# Patient Record
Sex: Female | Born: 1980 | Race: Black or African American | Hispanic: No | Marital: Single | Smoking: Never smoker
Health system: Southern US, Community
[De-identification: ages and names within clinical notes are randomized; demographics above are authoritative.]

## PROBLEM LIST (undated history)

## (undated) DIAGNOSIS — J45909 Unspecified asthma, uncomplicated: Secondary | ICD-10-CM

## (undated) DIAGNOSIS — F909 Attention-deficit hyperactivity disorder, unspecified type: Secondary | ICD-10-CM

## (undated) DIAGNOSIS — Z98811 Dental restoration status: Secondary | ICD-10-CM

## (undated) DIAGNOSIS — K219 Gastro-esophageal reflux disease without esophagitis: Secondary | ICD-10-CM

## (undated) DIAGNOSIS — M654 Radial styloid tenosynovitis [de Quervain]: Secondary | ICD-10-CM

## (undated) DIAGNOSIS — M1990 Unspecified osteoarthritis, unspecified site: Secondary | ICD-10-CM

## (undated) HISTORY — DX: Gastro-esophageal reflux disease without esophagitis: K21.9

## (undated) HISTORY — PX: KNEE ARTHROSCOPY: SUR90

## (undated) HISTORY — DX: Attention-deficit hyperactivity disorder, unspecified type: F90.9

## (undated) HISTORY — PX: TONSILLECTOMY AND ADENOIDECTOMY: SHX28

---
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
## 2000-04-20 ENCOUNTER — Encounter: Payer: Self-pay | Admitting: Internal Medicine

## 2000-04-20 ENCOUNTER — Emergency Department (HOSPITAL_COMMUNITY): Admission: EM | Admit: 2000-04-20 | Discharge: 2000-04-20 | Payer: Self-pay | Admitting: Internal Medicine

## 2001-03-10 ENCOUNTER — Other Ambulatory Visit: Admission: RE | Admit: 2001-03-10 | Discharge: 2001-03-10 | Payer: Self-pay | Admitting: *Deleted

## 2001-03-16 ENCOUNTER — Ambulatory Visit (HOSPITAL_COMMUNITY): Admission: RE | Admit: 2001-03-16 | Discharge: 2001-03-16 | Payer: Self-pay | Admitting: Gastroenterology

## 2002-11-17 ENCOUNTER — Emergency Department (HOSPITAL_COMMUNITY): Admission: EM | Admit: 2002-11-17 | Discharge: 2002-11-17 | Payer: Self-pay | Admitting: Emergency Medicine

## 2002-11-17 ENCOUNTER — Ambulatory Visit (HOSPITAL_COMMUNITY): Admission: RE | Admit: 2002-11-17 | Discharge: 2002-11-17 | Payer: Self-pay | Admitting: Internal Medicine

## 2003-07-16 ENCOUNTER — Emergency Department (HOSPITAL_COMMUNITY): Admission: EM | Admit: 2003-07-16 | Discharge: 2003-07-17 | Payer: Self-pay | Admitting: Emergency Medicine

## 2005-02-12 ENCOUNTER — Emergency Department (HOSPITAL_COMMUNITY): Admission: EM | Admit: 2005-02-12 | Discharge: 2005-02-12 | Payer: Self-pay | Admitting: Emergency Medicine

## 2005-11-05 ENCOUNTER — Ambulatory Visit: Payer: Self-pay | Admitting: Internal Medicine

## 2005-12-23 ENCOUNTER — Ambulatory Visit: Payer: Self-pay | Admitting: Internal Medicine

## 2006-02-17 ENCOUNTER — Emergency Department (HOSPITAL_COMMUNITY): Admission: EM | Admit: 2006-02-17 | Discharge: 2006-02-18 | Payer: Self-pay | Admitting: Emergency Medicine

## 2006-02-18 ENCOUNTER — Emergency Department (HOSPITAL_COMMUNITY): Admission: EM | Admit: 2006-02-18 | Discharge: 2006-02-18 | Payer: Self-pay | Admitting: Emergency Medicine

## 2006-02-18 ENCOUNTER — Ambulatory Visit (HOSPITAL_COMMUNITY): Admission: RE | Admit: 2006-02-18 | Discharge: 2006-02-18 | Payer: Self-pay | Admitting: Emergency Medicine

## 2006-02-18 ENCOUNTER — Encounter (INDEPENDENT_AMBULATORY_CARE_PROVIDER_SITE_OTHER): Payer: Self-pay | Admitting: *Deleted

## 2006-04-14 ENCOUNTER — Ambulatory Visit: Payer: Self-pay | Admitting: Internal Medicine

## 2006-04-15 LAB — CONVERTED CEMR LAB
ALT: 19 units/L (ref 0–40)
AST: 21 units/L (ref 0–37)
Albumin: 3.8 g/dL (ref 3.5–5.2)
Alkaline Phosphatase: 50 units/L (ref 39–117)
Amylase: 146 units/L — ABNORMAL HIGH (ref 27–131)
Basophils Absolute: 0 10*3/uL (ref 0.0–0.1)
Basophils Relative: 0.2 % (ref 0.0–1.0)
Bilirubin, Direct: 0.1 mg/dL (ref 0.0–0.3)
Eosinophil percent: 2 % (ref 0.0–5.0)
HCT: 36.6 % (ref 36.0–46.0)
Hemoglobin: 12.3 g/dL (ref 12.0–15.0)
Lymphocytes Relative: 28.9 % (ref 12.0–46.0)
MCHC: 33.5 g/dL (ref 30.0–36.0)
MCV: 93.4 fL (ref 78.0–100.0)
Monocytes Absolute: 0.3 10*3/uL (ref 0.2–0.7)
Monocytes Relative: 6.7 % (ref 3.0–11.0)
Neutro Abs: 2.9 10*3/uL (ref 1.4–7.7)
Neutrophils Relative %: 62.2 % (ref 43.0–77.0)
Platelets: 325 10*3/uL (ref 150–400)
RBC: 3.92 M/uL (ref 3.87–5.11)
RDW: 12.9 % (ref 11.5–14.6)
Total Bilirubin: 0.6 mg/dL (ref 0.3–1.2)
Total Protein: 7.3 g/dL (ref 6.0–8.3)
WBC: 4.7 10*3/uL (ref 4.5–10.5)

## 2006-08-09 ENCOUNTER — Emergency Department (HOSPITAL_COMMUNITY): Admission: EM | Admit: 2006-08-09 | Discharge: 2006-08-09 | Payer: Self-pay | Admitting: Emergency Medicine

## 2006-10-16 ENCOUNTER — Ambulatory Visit: Payer: Self-pay | Admitting: Family Medicine

## 2006-12-14 ENCOUNTER — Ambulatory Visit: Payer: Self-pay | Admitting: Family Medicine

## 2006-12-14 DIAGNOSIS — B353 Tinea pedis: Secondary | ICD-10-CM

## 2006-12-14 DIAGNOSIS — J309 Allergic rhinitis, unspecified: Secondary | ICD-10-CM | POA: Insufficient documentation

## 2006-12-14 DIAGNOSIS — J019 Acute sinusitis, unspecified: Secondary | ICD-10-CM | POA: Insufficient documentation

## 2006-12-21 ENCOUNTER — Telehealth (INDEPENDENT_AMBULATORY_CARE_PROVIDER_SITE_OTHER): Payer: Self-pay | Admitting: *Deleted

## 2007-04-28 ENCOUNTER — Emergency Department (HOSPITAL_COMMUNITY): Admission: EM | Admit: 2007-04-28 | Discharge: 2007-04-29 | Payer: Self-pay | Admitting: Emergency Medicine

## 2007-04-28 ENCOUNTER — Ambulatory Visit: Payer: Self-pay | Admitting: Internal Medicine

## 2007-04-28 DIAGNOSIS — R109 Unspecified abdominal pain: Secondary | ICD-10-CM | POA: Insufficient documentation

## 2007-05-02 ENCOUNTER — Emergency Department (HOSPITAL_COMMUNITY): Admission: EM | Admit: 2007-05-02 | Discharge: 2007-05-03 | Payer: Self-pay | Admitting: Family Medicine

## 2007-05-03 ENCOUNTER — Telehealth (INDEPENDENT_AMBULATORY_CARE_PROVIDER_SITE_OTHER): Payer: Self-pay | Admitting: *Deleted

## 2007-05-12 ENCOUNTER — Ambulatory Visit: Payer: Self-pay | Admitting: Internal Medicine

## 2007-06-25 ENCOUNTER — Telehealth (INDEPENDENT_AMBULATORY_CARE_PROVIDER_SITE_OTHER): Payer: Self-pay | Admitting: *Deleted

## 2007-08-06 ENCOUNTER — Ambulatory Visit: Payer: Self-pay | Admitting: Family Medicine

## 2007-08-06 DIAGNOSIS — K219 Gastro-esophageal reflux disease without esophagitis: Secondary | ICD-10-CM | POA: Insufficient documentation

## 2007-08-16 LAB — CONVERTED CEMR LAB
Albumin: 3.5 g/dL (ref 3.5–5.2)
BUN: 6 mg/dL (ref 6–23)
Bilirubin, Direct: 0.1 mg/dL (ref 0.0–0.3)
Calcium: 9.1 mg/dL (ref 8.4–10.5)
Chloride: 104 meq/L (ref 96–112)
Eosinophils Relative: 1.7 % (ref 0.0–5.0)
GFR calc non Af Amer: 92 mL/min
Hemoglobin: 12.4 g/dL (ref 12.0–15.0)
Lymphocytes Relative: 33.2 % (ref 12.0–46.0)
MCHC: 33.3 g/dL (ref 30.0–36.0)
MCV: 91.5 fL (ref 78.0–100.0)
Monocytes Relative: 10.9 % (ref 3.0–11.0)
Neutro Abs: 2.4 10*3/uL (ref 1.4–7.7)
Potassium: 3.8 meq/L (ref 3.5–5.1)
RBC: 4.07 M/uL (ref 3.87–5.11)
RDW: 12.7 % (ref 11.5–14.6)
TSH: 0.82 microintl units/mL (ref 0.35–5.50)
Total CHOL/HDL Ratio: 3.4
Total Protein: 7.2 g/dL (ref 6.0–8.3)
Triglycerides: 97 mg/dL (ref 0–149)

## 2007-08-19 ENCOUNTER — Telehealth (INDEPENDENT_AMBULATORY_CARE_PROVIDER_SITE_OTHER): Payer: Self-pay | Admitting: *Deleted

## 2007-08-19 ENCOUNTER — Ambulatory Visit: Payer: Self-pay | Admitting: Family Medicine

## 2007-08-19 DIAGNOSIS — J45909 Unspecified asthma, uncomplicated: Secondary | ICD-10-CM | POA: Insufficient documentation

## 2007-08-19 DIAGNOSIS — R05 Cough: Secondary | ICD-10-CM

## 2007-08-23 ENCOUNTER — Encounter (INDEPENDENT_AMBULATORY_CARE_PROVIDER_SITE_OTHER): Payer: Self-pay | Admitting: *Deleted

## 2007-09-15 ENCOUNTER — Telehealth (INDEPENDENT_AMBULATORY_CARE_PROVIDER_SITE_OTHER): Payer: Self-pay | Admitting: *Deleted

## 2007-12-23 ENCOUNTER — Encounter: Payer: Self-pay | Admitting: Family Medicine

## 2007-12-24 ENCOUNTER — Telehealth (INDEPENDENT_AMBULATORY_CARE_PROVIDER_SITE_OTHER): Payer: Self-pay | Admitting: *Deleted

## 2008-04-19 LAB — CONVERTED CEMR LAB: Pap Smear: NORMAL

## 2008-05-08 ENCOUNTER — Ambulatory Visit: Payer: Self-pay | Admitting: Family Medicine

## 2008-05-08 ENCOUNTER — Ambulatory Visit (HOSPITAL_COMMUNITY): Admission: RE | Admit: 2008-05-08 | Discharge: 2008-05-08 | Payer: Self-pay | Admitting: Family Medicine

## 2008-05-08 DIAGNOSIS — R1031 Right lower quadrant pain: Secondary | ICD-10-CM | POA: Insufficient documentation

## 2008-05-08 LAB — CONVERTED CEMR LAB
Bilirubin Urine: NEGATIVE
Blood in Urine, dipstick: NEGATIVE
Glucose, Urine, Semiquant: NEGATIVE
Specific Gravity, Urine: <1.005
WBC Urine, dipstick: NEGATIVE

## 2008-05-09 ENCOUNTER — Telehealth (INDEPENDENT_AMBULATORY_CARE_PROVIDER_SITE_OTHER): Payer: Self-pay | Admitting: *Deleted

## 2008-05-26 LAB — CONVERTED CEMR LAB
AST: 24 units/L (ref 0–37)
Albumin: 3.8 g/dL (ref 3.5–5.2)
Basophils Absolute: 0 10*3/uL (ref 0.0–0.1)
Basophils Relative: 0 % (ref 0.0–3.0)
Bilirubin, Direct: 0.1 mg/dL (ref 0.0–0.3)
CO2: 28 meq/L (ref 19–32)
Chloride: 103 meq/L (ref 96–112)
Creatinine, Ser: 0.8 mg/dL (ref 0.4–1.2)
Hemoglobin: 13.1 g/dL (ref 12.0–15.0)
Lymphocytes Relative: 33.1 % (ref 12.0–46.0)
MCHC: 34.3 g/dL (ref 30.0–36.0)
Monocytes Relative: 6.3 % (ref 3.0–12.0)
Neutrophils Relative %: 58.9 % (ref 43.0–77.0)
Platelets: 260 10*3/uL (ref 150–400)
Potassium: 4.2 meq/L (ref 3.5–5.1)
RBC: 4.1 M/uL (ref 3.87–5.11)
RDW: 13.5 % (ref 11.5–14.6)
Sodium: 137 meq/L (ref 135–145)
Total Bilirubin: 0.6 mg/dL (ref 0.3–1.2)

## 2008-05-29 ENCOUNTER — Telehealth (INDEPENDENT_AMBULATORY_CARE_PROVIDER_SITE_OTHER): Payer: Self-pay | Admitting: *Deleted

## 2008-06-08 ENCOUNTER — Ambulatory Visit: Payer: Self-pay | Admitting: Family Medicine

## 2008-06-09 ENCOUNTER — Ambulatory Visit: Payer: Self-pay | Admitting: Family Medicine

## 2008-06-13 ENCOUNTER — Encounter (INDEPENDENT_AMBULATORY_CARE_PROVIDER_SITE_OTHER): Payer: Self-pay | Admitting: *Deleted

## 2008-07-24 ENCOUNTER — Ambulatory Visit: Payer: Self-pay | Admitting: Family Medicine

## 2008-07-24 DIAGNOSIS — J020 Streptococcal pharyngitis: Secondary | ICD-10-CM | POA: Insufficient documentation

## 2008-07-24 LAB — CONVERTED CEMR LAB: Rapid Strep: POSITIVE

## 2008-07-25 ENCOUNTER — Telehealth (INDEPENDENT_AMBULATORY_CARE_PROVIDER_SITE_OTHER): Payer: Self-pay | Admitting: *Deleted

## 2008-07-27 ENCOUNTER — Telehealth (INDEPENDENT_AMBULATORY_CARE_PROVIDER_SITE_OTHER): Payer: Self-pay | Admitting: *Deleted

## 2008-10-27 ENCOUNTER — Telehealth (INDEPENDENT_AMBULATORY_CARE_PROVIDER_SITE_OTHER): Payer: Self-pay | Admitting: *Deleted

## 2009-01-08 ENCOUNTER — Telehealth (INDEPENDENT_AMBULATORY_CARE_PROVIDER_SITE_OTHER): Payer: Self-pay | Admitting: *Deleted

## 2009-01-12 ENCOUNTER — Encounter: Payer: Self-pay | Admitting: Family Medicine

## 2009-01-12 ENCOUNTER — Telehealth (INDEPENDENT_AMBULATORY_CARE_PROVIDER_SITE_OTHER): Payer: Self-pay | Admitting: *Deleted

## 2009-06-28 ENCOUNTER — Telehealth (INDEPENDENT_AMBULATORY_CARE_PROVIDER_SITE_OTHER): Payer: Self-pay | Admitting: *Deleted

## 2009-08-09 ENCOUNTER — Encounter: Payer: Self-pay | Admitting: Family Medicine

## 2009-08-09 ENCOUNTER — Ambulatory Visit (HOSPITAL_COMMUNITY): Admission: RE | Admit: 2009-08-09 | Discharge: 2009-08-09 | Payer: Self-pay | Admitting: Gastroenterology

## 2009-08-17 ENCOUNTER — Telehealth (INDEPENDENT_AMBULATORY_CARE_PROVIDER_SITE_OTHER): Payer: Self-pay | Admitting: *Deleted

## 2009-08-23 ENCOUNTER — Encounter: Payer: Self-pay | Admitting: Family Medicine

## 2009-08-23 HISTORY — PX: CHOLECYSTECTOMY: SHX55

## 2009-08-27 ENCOUNTER — Encounter (INDEPENDENT_AMBULATORY_CARE_PROVIDER_SITE_OTHER): Payer: Self-pay | Admitting: *Deleted

## 2009-08-29 ENCOUNTER — Telehealth: Payer: Self-pay | Admitting: Family Medicine

## 2009-08-29 ENCOUNTER — Emergency Department (HOSPITAL_COMMUNITY): Admission: EM | Admit: 2009-08-29 | Discharge: 2009-08-29 | Payer: Self-pay | Admitting: Emergency Medicine

## 2009-08-29 ENCOUNTER — Ambulatory Visit: Payer: Self-pay | Admitting: Family Medicine

## 2009-08-29 DIAGNOSIS — J45901 Unspecified asthma with (acute) exacerbation: Secondary | ICD-10-CM | POA: Insufficient documentation

## 2009-09-10 ENCOUNTER — Encounter: Payer: Self-pay | Admitting: Family Medicine

## 2009-10-25 ENCOUNTER — Ambulatory Visit: Payer: Self-pay | Admitting: Family Medicine

## 2009-10-25 DIAGNOSIS — R252 Cramp and spasm: Secondary | ICD-10-CM

## 2009-10-25 DIAGNOSIS — M722 Plantar fascial fibromatosis: Secondary | ICD-10-CM

## 2009-10-25 DIAGNOSIS — M62838 Other muscle spasm: Secondary | ICD-10-CM

## 2009-10-26 ENCOUNTER — Ambulatory Visit: Payer: Self-pay | Admitting: Family Medicine

## 2009-10-30 LAB — CONVERTED CEMR LAB
BUN: 9 mg/dL (ref 6–23)
CO2: 25 meq/L (ref 19–32)
Calcium: 8.9 mg/dL (ref 8.4–10.5)
Chloride: 104 meq/L (ref 96–112)
Creatinine, Ser: 0.77 mg/dL (ref 0.40–1.20)
Glucose, Bld: 89 mg/dL (ref 70–99)
Sodium: 139 meq/L (ref 135–145)

## 2009-12-21 ENCOUNTER — Ambulatory Visit: Payer: Self-pay | Admitting: Family Medicine

## 2009-12-21 DIAGNOSIS — B373 Candidiasis of vulva and vagina: Secondary | ICD-10-CM | POA: Insufficient documentation

## 2009-12-21 DIAGNOSIS — J069 Acute upper respiratory infection, unspecified: Secondary | ICD-10-CM | POA: Insufficient documentation

## 2009-12-21 LAB — CONVERTED CEMR LAB
Bilirubin Urine: NEGATIVE
Ketones, urine, test strip: NEGATIVE
Nitrite: NEGATIVE
Specific Gravity, Urine: 1.02
WBC Urine, dipstick: NEGATIVE

## 2009-12-25 ENCOUNTER — Telehealth (INDEPENDENT_AMBULATORY_CARE_PROVIDER_SITE_OTHER): Payer: Self-pay | Admitting: *Deleted

## 2009-12-25 LAB — CONVERTED CEMR LAB
Chlamydia, Swab/Urine, PCR: NEGATIVE
GC Probe Amp, Urine: NEGATIVE

## 2010-01-02 ENCOUNTER — Ambulatory Visit: Payer: Self-pay | Admitting: Family Medicine

## 2010-01-03 LAB — CONVERTED CEMR LAB
Alkaline Phosphatase: 66 units/L (ref 39–117)
Basophils Absolute: 0 10*3/uL (ref 0.0–0.1)
Bilirubin, Direct: 0.1 mg/dL (ref 0.0–0.3)
CO2: 24 meq/L (ref 19–32)
Chloride: 108 meq/L (ref 96–112)
Creatinine, Ser: 0.7 mg/dL (ref 0.4–1.2)
Eosinophils Absolute: 0.1 10*3/uL (ref 0.0–0.7)
GFR calc non Af Amer: 124.87 mL/min (ref >60)
Glucose, Bld: 81 mg/dL (ref 70–99)
LDL Cholesterol: 115 mg/dL — ABNORMAL HIGH (ref 0–99)
Lymphocytes Relative: 34.8 % (ref 12.0–46.0)
Lymphs Abs: 1.7 10*3/uL (ref 0.7–4.0)
MCHC: 34.2 g/dL (ref 30.0–36.0)
Monocytes Absolute: 0.5 10*3/uL (ref 0.1–1.0)
Neutrophils Relative %: 53 % (ref 43.0–77.0)
Platelets: 228 10*3/uL (ref 150.0–400.0)
Potassium: 4.4 meq/L (ref 3.5–5.1)
RBC: 3.98 M/uL (ref 3.87–5.11)
RDW: 14.8 % — ABNORMAL HIGH (ref 11.5–14.6)
Total Bilirubin: 0.4 mg/dL (ref 0.3–1.2)
Triglycerides: 52 mg/dL (ref 0.0–149.0)

## 2010-01-09 ENCOUNTER — Ambulatory Visit: Payer: Self-pay | Admitting: Family Medicine

## 2010-01-09 DIAGNOSIS — L259 Unspecified contact dermatitis, unspecified cause: Secondary | ICD-10-CM

## 2010-01-11 ENCOUNTER — Encounter: Payer: Self-pay | Admitting: Family Medicine

## 2010-01-30 ENCOUNTER — Ambulatory Visit: Payer: Self-pay | Admitting: Family Medicine

## 2010-01-30 DIAGNOSIS — IMO0001 Reserved for inherently not codable concepts without codable children: Secondary | ICD-10-CM

## 2010-01-30 LAB — CONVERTED CEMR LAB
Ketones, urine, test strip: NEGATIVE
Nitrite: NEGATIVE
Protein, U semiquant: NEGATIVE
Specific Gravity, Urine: 1.015
Urobilinogen, UA: 0.2
WBC Urine, dipstick: NEGATIVE
pH: 6.5

## 2010-01-31 ENCOUNTER — Encounter: Payer: Self-pay | Admitting: Family Medicine

## 2010-02-01 ENCOUNTER — Emergency Department (HOSPITAL_COMMUNITY): Admission: EM | Admit: 2010-02-01 | Discharge: 2010-02-01 | Payer: Self-pay | Admitting: Emergency Medicine

## 2010-02-01 ENCOUNTER — Telehealth (INDEPENDENT_AMBULATORY_CARE_PROVIDER_SITE_OTHER): Payer: Self-pay | Admitting: *Deleted

## 2010-02-01 LAB — CONVERTED CEMR LAB
Albumin: 4 g/dL (ref 3.5–5.2)
Alkaline Phosphatase: 59 units/L (ref 39–117)
Anti Nuclear Antibody(ANA): NEGATIVE
BUN: 8 mg/dL (ref 6–23)
Basophils Absolute: 0 10*3/uL (ref 0.0–0.1)
Eosinophils Relative: 0.1 % (ref 0.0–5.0)
GFR calc non Af Amer: 108.74 mL/min (ref >60)
Glucose, Bld: 88 mg/dL (ref 70–99)
HCT: 41.4 % (ref 36.0–46.0)
Hemoglobin: 14 g/dL (ref 12.0–15.0)
Lymphocytes Relative: 21.1 % (ref 12.0–46.0)
Monocytes Absolute: 0.5 10*3/uL (ref 0.1–1.0)
Monocytes Relative: 6.3 % (ref 3.0–12.0)
Platelets: 267 10*3/uL (ref 150.0–400.0)
Potassium: 3.9 meq/L (ref 3.5–5.1)
RBC: 4.34 M/uL (ref 3.87–5.11)
RDW: 14.2 % (ref 11.5–14.6)
Rhuematoid fact SerPl-aCnc: <20 intl units/mL (ref 0–20)
Vit D, 25-Hydroxy: 18 ng/mL — ABNORMAL LOW (ref 30–89)

## 2010-02-05 ENCOUNTER — Encounter: Admission: RE | Admit: 2010-02-05 | Discharge: 2010-02-05 | Payer: Self-pay | Admitting: Sports Medicine

## 2010-02-05 ENCOUNTER — Telehealth: Payer: Self-pay | Admitting: Family Medicine

## 2010-03-08 ENCOUNTER — Ambulatory Visit: Payer: Self-pay | Admitting: Family Medicine

## 2010-03-08 DIAGNOSIS — R112 Nausea with vomiting, unspecified: Secondary | ICD-10-CM

## 2010-03-09 ENCOUNTER — Emergency Department (HOSPITAL_COMMUNITY): Admission: EM | Admit: 2010-03-09 | Discharge: 2010-03-09 | Payer: Self-pay | Admitting: Emergency Medicine

## 2010-03-11 LAB — CONVERTED CEMR LAB
AST: 20 units/L (ref 0–37)
Albumin: 4.1 g/dL (ref 3.5–5.2)
Alkaline Phosphatase: 68 units/L (ref 39–117)
Basophils Absolute: 0 10*3/uL (ref 0.0–0.1)
Creatinine, Ser: 0.8 mg/dL (ref 0.4–1.2)
Eosinophils Absolute: 0 10*3/uL (ref 0.0–0.7)
Eosinophils Relative: 0.4 % (ref 0.0–5.0)
Glucose, Bld: 80 mg/dL (ref 70–99)
HCT: 39.5 % (ref 36.0–46.0)
Hemoglobin: 13.3 g/dL (ref 12.0–15.0)
Lipase: 25 units/L (ref 11.0–59.0)
Lymphocytes Relative: 21.7 % (ref 12.0–46.0)
MCHC: 33.6 g/dL (ref 30.0–36.0)
Monocytes Absolute: 0.6 10*3/uL (ref 0.1–1.0)
Neutrophils Relative %: 68.2 % (ref 43.0–77.0)
Potassium: 4.1 meq/L (ref 3.5–5.1)
Sodium: 141 meq/L (ref 135–145)
Total Bilirubin: 0.6 mg/dL (ref 0.3–1.2)
Total Protein: 7.7 g/dL (ref 6.0–8.3)

## 2010-06-05 ENCOUNTER — Encounter: Payer: Self-pay | Admitting: Family Medicine

## 2010-06-05 ENCOUNTER — Ambulatory Visit: Payer: Self-pay | Admitting: Family Medicine

## 2010-06-10 LAB — CONVERTED CEMR LAB: Vit D, 25-Hydroxy: 61 ng/mL (ref 30–89)

## 2010-07-28 LAB — CONVERTED CEMR LAB
Beta hcg, urine, semiquantitative: NEGATIVE
Bilirubin Urine: NEGATIVE
Glucose, Urine, Semiquant: NEGATIVE
Ketones, urine, test strip: NEGATIVE
Nitrite: NEGATIVE
Protein, U semiquant: NEGATIVE
WBC Urine, dipstick: NEGATIVE

## 2010-07-30 NOTE — Progress Notes (Signed)
Summary: lab results  Phone Note Outgoing Call Call back at Cell# [phone]   Call placed by: [facility] CMA,  February 01, 2010 1:08 PM Details for Reason: sed rate elevated--repeat after abx rest of labs are still pending---- make sure ANA, RA were drawn --low vita D --- take vita D3 2000u daily --- and rx vita D 50000u 1 weekly #4  2 refills---recheck in 3 months----low vita d can cause muscle aches.   ANA--neg --lupus test   Summary of Call: left message to call office...................Marland KitchenFelecia Deloach CMA  February 01, 2010 1:08 PM  left message to call office.............Marland KitchenFelecia Deloach CMA  February 04, 2010 12:16 PM   left pt detail message of result and to call to confirmed pharmacy to send rx............Marland KitchenFelecia Deloach CMA  February 04, 2010 12:17 PM  Patient returning your call  Follow-up for Phone Call        DISCUSS WITH PATIENT, rx sent to pharmacy.........Marland KitchenFelecia Deloach CMA  February 04, 2010 2:47 PM     New/Updated Medications: VITAMIN D (ERGOCALCIFEROL) 50000 UNIT CAPS (ERGOCALCIFEROL) Take 1 tab once weekly Prescriptions: VITAMIN D (ERGOCALCIFEROL) 50000 UNIT CAPS (ERGOCALCIFEROL) Take 1 tab once weekly  #4 x 2   Entered by:   Jeremy Johann CMA   Authorized by:   Loreen Freud DO   Signed by:   Jeremy Johann CMA on 02/04/2010   Method used:   Faxed to ...       [facility]  [address] # [id number]* (retail)       [address]       [place], [place]  [address]       Ph: [phone] or [phone]       Fax: [phone]   RxID:   [id number]

## 2010-07-30 NOTE — Assessment & Plan Note (Signed)
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  The symptoms began 1 week ago.  The patient reports productive cough, shortness of breath, wheezing, and exertional dyspnea, but denies non-productive cough, fever, and hemoptysis.  The patient denies the following symptoms: cold/URI symptoms, sore throat, nasal congestion, chronic rhinitis, weight loss, acid reflux symptoms, and peripheral edema.  The cough is worse with exercise and activity.  Ineffective prior treatments have included albuterol inhaler and other asthma medication.  Risk factors include history of asthma.    Current Medications (verified): 1)  Xyzal 5 Mg  Tabs (Levocetirizine Dihydrochloride) .Marland Kitchen.. 1 By Mouth Once Daily 2)  Bcp 3)  Symbicort 160-4.5 Mcg/act  Aero (Budesonide-Formoterol Fumarate) .... 2 Puffs Two Times A Day 4)  Omeprazole 20 Mg Tbec (Omeprazole) .Marland Kitchen.. 1 By Mouth Once Daily 5)  Flonase 50 Mcg/act Susp (Fluticasone Propionate) .... 2 Sprays in Each Nostril Once Daily 6)  Proair Hfa 108 (90 Base) Mcg/act Aers (Albuterol Sulfate) .... 2 Puffs Qid As Needed  Allergies: 1)  ! Zithromax 2)  ! Depo-Medrol 3)  ! Darvocet 4)  ! Percocet  Past History:  Past medical, surgical, family and social histories (including risk factors) reviewed for relevance to current acute and chronic problems.  Past Medical History: Reviewed history from 08/06/2007 and no changes  required. Allergic rhinitis G0P0 GERD  Past Surgical History: Reviewed history from 08/06/2007 and no changes required. no history of surgeries Tonsillectomy arthroscopic knees--b/l  Family History: Reviewed history from 08/06/2007 and no changes required. Family History of CAD Female 1st degree relative --F 30yo Family History Diabetes 1st degree relative Family History High cholesterol Family History Hypertension Family History Ovarian cancer Family History Depression Family History of Anemia/FE deficiency  Social History: Reviewed history from 08/06/2007 and no changes required. 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  Otoscopic examination reveals clear canals, tympanic membranes are intact bilaterally without bulging, retraction, inflammation or discharge. Hearing is grossly normal bilaterally. Nose:  External nasal examination shows no deformity or inflammation. Nasal mucosa are pink and moist without lesions or exudates. Mouth:  Oral mucosa and oropharynx without lesions or exudates.  Teeth in good repair. Neck:  No deformities, masses, or tenderness noted. Lungs:  R wheezes and L wheezes.  improved after neb Heart:  Normal rate and regular rhythm. S1 and S2 normal without gallop, murmur, click, rub or other extra sounds. Neurologic:  No cranial nerve deficits noted. Station and gait are normal. Plantar reflexes are down-going bilaterally. DTRs are  symmetrical throughout. Sensory, motor and coordinative functions appear intact. Skin:  Intact without suspicious lesions or rashes Cervical Nodes:  No lymphadenopathy noted Psych:  Cognition and judgment appear intact. Alert and  cooperative with normal attention span and concentration. No apparent delusions, illusions, hallucinations   Impression & Recommendations:  Problem # 1:  ASTHMA NOS W/ACUTE EXACERBATION (ICD-493.92)  Her updated medication list for this problem includes:    Symbicort 160-4.5 Mcg/act Aero (Budesonide-formoterol fumarate) .Marland Kitchen... 2 puffs two times a day    Proair Hfa 108 (90 Base) Mcg/act Aers (Albuterol sulfate) .Marland Kitchen... 2 puffs qid as needed  Orders: Nebulizer Tx (27[address]) Albuterol Sulfate Sol 1mg  unit dose (G6440)  Problem # 2:  GERD (ICD-530.81)  The following medications were removed from the medication list:    Aciphex 20 Mg Tbec (Rabeprazole sodium) .Marland Kitchen... 1 by mouth once daily Her updated medication list for this problem includes:    Omeprazole 20 Mg Tbec (Omeprazole) .Marland Kitchen... 1 by mouth once daily  Diagnostics Reviewed:  Discussed lifestyle modifications, diet, antacids/medications, and preventive measures. Handout provided.   Complete Medication List: 1)  Xyzal 5 Mg Tabs (Levocetirizine dihydrochloride) .Marland Kitchen.. 1 by mouth once daily 2)  Bcp  3)  Symbicort 160-4.5 Mcg/act Aero (Budesonide-formoterol fumarate) .... 2 puffs two times a day 4)  Omeprazole 20 Mg Tbec (Omeprazole) .Marland Kitchen.. 1 by mouth once daily 5)  Flonase 50 Mcg/act Susp (Fluticasone propionate) .... 2 sprays in each nostril once daily 6)  Proair Hfa 108 (90 Base) Mcg/act Aers (Albuterol sulfate) .... 2 puffs qid as needed Prescriptions: PROAIR HFA 108 (90 BASE) MCG/ACT AERS (ALBUTEROL SULFATE) 2 puffs qid as needed  #1 x 0   Entered and Authorized by:   Loreen Freud DO   Signed by:   Loreen Freud DO on 08/29/2009   Method used:   Historical   RxID:   [id number] SYMBICORT 160-4.5 MCG/ACT  AERO (BUDESONIDE-FORMOTEROL FUMARATE) 2 puffs two times a day  #1 x 5   Entered and Authorized by:   Loreen Freud DO   Signed by:   Loreen Freud DO on 08/29/2009   Method used:   Electronically to        [facility] #  [id number]* (retail)       [address]       [place], [place]  [address]       Ph: [phone] or [phone]       Fax: [phone]   RxID:   [id number]    EKG  Procedure date:  08/29/2009  Findings:      Normal sinus rhythm with rate of:  83 bpm      Medication Administration  Medication # 1:    Medication: Albuterol Sulfate Sol 1mg  unit dose    Diagnosis: ASTHMA NOS W/ACUTE EXACERBATION (ICD-493.92)  Orders Added: 1)  Est. Patient Level IV [60737] 2)  Nebulizer Tx [10626] 3)  Albuterol Sulfate Sol 1mg  unit dose [R4854]

## 2010-07-30 NOTE — Assessment & Plan Note (Signed)
Summary: SINUS INFECTION//KN   Vital Signs:  Patient profile:   30 year old female Height:      70.5 inches Weight:      244 pounds Temp:     98.2 degrees F oral Pulse rate:   70 / minute BP sitting:   110 / 90  (left arm)  Vitals Entered By: Jeremy Johann CMA (January 30, 2010 10:57 AM) CC: facial pain/pressure, bodyache in hands and legs, URI symptoms   History of Present Illness:       This is a 30 year old woman who presents with URI symptoms.  The symptoms began 1 week ago.  The patient complains of sore throat, but denies nasal congestion, clear nasal discharge, purulent nasal discharge, dry cough, productive cough, earache, and sick contacts.  The patient denies fever, low-grade fever (<100.5 degrees), fever of 100.5-103 degrees, fever of 103.1-104 degrees, fever to >104 degrees, stiff neck, dyspnea, wheezing, rash, vomiting, diarrhea, use of an antipyretic, and response to antipyretic.  The patient also reports headache and muscle aches.  The patient denies itchy watery eyes, itchy throat, sneezing, seasonal symptoms, response to antihistamine, and severe fatigue.  Risk factors for Strep sinusitis include unilateral facial pain, tender adenopathy, and absence of cough.  The patient denies the following risk factors for Strep sinusitis: unilateral nasal discharge, poor response to decongestant, double sickening, tooth pain, and Strep exposure.  Pt also c/o burning pain in back and urinary frequency.    Current Medications (verified): 1)  Xyzal 5 Mg  Tabs (Levocetirizine Dihydrochloride) .Marland Kitchen.. 1 By Mouth Once Daily 2)  Aciphex .Marland KitchenMarland Kitchen. 1 By Mouth Once Daily 3)  Astepro 0.15 % Soln (Azelastine Hcl) .... 2 Sprays Each Nostril Once Daily 4)  Nasonex 50 Mcg/act Susp (Mometasone Furoate) .... 2 Sprays Each Nostril Once Daily 5)  Beyaz 3-0.02-0.451 Mg Tabs (Drospiren-Eth Estrad-Levomefol) .... As Directed 6)  Claritin  (Loratadine) .... Once Daily 7)  Augmentin 875-125 Mg Tabs (Amoxicillin-Pot  Clavulanate) .Marland Kitchen.. 1 By Mouth Two Times A Day 8)  Mobic 15 Mg Tabs (Meloxicam) .Marland Kitchen.. 1 By Mouth Once Daily As Needed Pain  Allergies: 1)  ! Zithromax 2)  ! Depo-Medrol 3)  ! Darvocet 4)  ! Percocet  Past History:  Past medical, surgical, family and social histories (including risk factors) reviewed for relevance to current acute and chronic problems.  Past Medical History: Reviewed history from 08/06/2007 and no changes required. Allergic rhinitis G0P0 GERD  Past Surgical History: Reviewed history from 01/02/2010 and no changes required. Tonsillectomy arthroscopic knees--b/l Cholecystectomy (08/23/2009)  Family History: Reviewed history from 08/06/2007 and no changes required. Family History of CAD Female 1st degree relative --F 30yo Family History Diabetes 1st degree relative Family History High cholesterol Family History Hypertension Family History Ovarian cancer Family History Depression Family History of Anemia/FE deficiency  Social History: Reviewed history from 08/06/2007 and no changes required. 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  Otoscopic examination reveals clear canals, tympanic membranes are intact bilaterally without bulging, retraction, inflammation or discharge. Hearing is grossly normal bilaterally. Nose:  External nasal examination shows no deformity or inflammation. Nasal mucosa are pink and moist without lesions or exudates. Mouth:  no exudates and pharyngeal erythema.   Neck:  supple and cervical lymphadenopathy.  Lungs:  Normal respiratory effort, chest expands symmetrically. Lungs are clear to auscultation, no crackles or wheezes. Heart:  Normal rate  and regular rhythm. S1 and S2 normal without gallop, murmur, click, rub or other extra sounds. Msk:  normal ROM, no joint swelling, no joint warmth, and no redness over joints.   No pain in hands with palpation Extremities:  No clubbing, cyanosis, edema, or deformity noted with normal full range of motion of all joints.   Skin:  Intact without suspicious lesions or rashes Cervical Nodes:  R anterior LN tender and L anterior LN tender.   Psych:  Cognition and judgment appear intact. Alert and cooperative with normal attention span and concentration. No apparent delusions, illusions, hallucinations   Impression & Recommendations:  Problem # 1:  ACUTE SINUSITIS, UNSPECIFIED (ICD-461.9)  Her updated medication list for this problem includes:    Astepro 0.15 % Soln (Azelastine hcl) .Marland Kitchen... 2 sprays each nostril once daily    Nasonex 50 Mcg/act Susp (Mometasone furoate) .Marland Kitchen... 2 sprays each nostril once daily    Augmentin 875-125 Mg Tabs (Amoxicillin-pot clavulanate) .Marland Kitchen... 1 by mouth two times a day  Instructed on treatment. Call if symptoms persist or worsen.   Orders: Rapid Strep ([address]) UA Dipstick w/o Micro (manual) (60454)  Problem # 2:  MYALGIA (ICD-729.1)  Her updated medication list for this problem includes:    Mobic 15 Mg Tabs (Meloxicam) .Marland Kitchen... 1 by mouth once daily as needed pain  Orders: Venipuncture ([address]) TLB-BMP (Basic Metabolic Panel-BMET) (80048-METABOL) TLB-CBC Platelet - w/Differential (85025-CBCD) TLB-Hepatic/Liver Function Pnl (80076-HEPATIC) T-Vitamin D (25-Hydroxy) (91478-29562) T-Antinuclear Antib (ANA) (13086-57846) TLB-Rheumatoid Factor (RA) (96295-MW) TLB-Sedimentation Rate (ESR) (85652-ESR) T-Lyme Disease (41324-40102) T- * Misc. Laboratory test [phone]) Rapid Strep ([address]) UA Dipstick w/o Micro (manual) (47425)  Complete Medication List: 1)  Xyzal 5 Mg Tabs (Levocetirizine dihydrochloride) .Marland Kitchen.. 1 by mouth once daily 2)  Aciphex  .Marland KitchenMarland Kitchen. 1 by mouth once  daily 3)  Astepro 0.15 % Soln (Azelastine hcl) .... 2 sprays each nostril once daily 4)  Nasonex 50 Mcg/act Susp (Mometasone furoate) .... 2 sprays each nostril once daily 5)  Beyaz 3-0.02-0.451 Mg Tabs (Drospiren-eth estrad-levomefol) .... As directed 6)  Claritin (loratadine)  .... Once daily 7)  Augmentin 875-125 Mg Tabs (Amoxicillin-pot clavulanate) .Marland Kitchen.. 1 by mouth two times a day 8)  Mobic 15 Mg Tabs (Meloxicam) .Marland Kitchen.. 1 by mouth once daily as needed pain Prescriptions: MOBIC 15 MG TABS (MELOXICAM) 1 by mouth once daily as needed pain  #30 x 1   Entered and Authorized by:   Loreen Freud DO   Signed by:   Loreen Freud DO on 01/30/2010   Method used:   Electronically to        [facility] # [id number]* (retail)       [address]       [place], [place]  [address]       Ph: [phone] or [phone]       Fax: [phone]   RxID:   [id number] AUGMENTIN 875-125 MG TABS (AMOXICILLIN-POT CLAVULANATE) 1 by mouth two times a day  #20 x 0   Entered and Authorized by:   Loreen Freud DO   Signed by:   Loreen Freud DO on 01/30/2010   Method used:   Electronically to        [facility] # [id number]* (retail)       [address]  [place], [place]  [address]       Ph: [phone] or [phone]       Fax: [phone]   RxID:   [id number]   Laboratory Results   Urine Tests   Date/Time Reported: January 30, 2010 11:24 AM  Routine Urinalysis   Color: yellow Appearance: Clear Glucose: negative   (Normal Range: Negative) Bilirubin: negative   (Normal Range: Negative) Ketone: negative   (Normal Range: Negative) Spec. Gravity: 1.015   (Normal Range: 1.003-1.035) Blood: negative   (Normal Range: Negative) pH: 6.5   (Normal Range: 5.0-8.0) Protein: negative   (Normal Range: Negative) Urobilinogen: 0.2   (Normal Range: 0-1) Nitrite: negative   (Normal Range: Negative) Leukocyte Esterace: negative   (Normal Range:  Negative)     Date/Time Reported: January 30, 2010 11:31 AM  Other Tests  Rapid Strep: negative    Appended Document: Orders Update    Clinical Lists Changes  Orders: Added new Test order of T- * Misc. Laboratory test [phone]) - Signed

## 2010-07-30 NOTE — Progress Notes (Signed)
Summary: Results (lmom 2/18,2/22,2/23,2/24)  Phone Note Outgoing Call   Call placed by: Army Fossa CMA,  August 17, 2009 8:56 AM Summary of Call: Regarding Lab Results, LMTCB:  Send to Dr Loreta Ave as well. Let pt know ---abnormality on Liver --she may need CT as well but we will fax to DR Loreta Ave  Follow-up for Phone Call        [facility]. Army Fossa CMA  August 21, 2009 11:42 AM   Additional Follow-up for Phone Call Additional follow up Details #1::        LMTCB. Army Fossa CMA  August 22, 2009 9:42 AM     Additional Follow-up for Phone Call Additional follow up Details #2::    LMTCB. Army Fossa CMA  August 23, 2009 1:40 PM   Additional Follow-up for Phone Call Additional follow up Details #3:: Details for Additional Follow-up Action Taken: mailed pt a letter to contact the office. Army Fossa CMA  August 27, 2009 5:07 PM

## 2010-07-30 NOTE — Assessment & Plan Note (Signed)
Summary: vaginal itch/cbs pt will arrive at 1:15   Vital Signs:  Patient profile:   30 year old female Weight:      251 pounds Pulse rate:   66 / minute Pulse rhythm:   regular BP sitting:   122 / 72  (left arm) Cuff size:   large  Vitals Entered By: Army Fossa CMA (December 21, 2009 1:19 PM) CC: Pt here for c/o vaginal itching no discharge, there is an odor x 2-3 weeks.   History of Present Illness: Pt here c/o vaginal itching and odor.  No d/c.  Pt is not sexually active.   We were going to do a vaginal exam and then she said she had already been to gyn and had a pelvic exam-- everything was normal --gyn gave her a cream for vaginal area.    Then pt c/o sore throat for same amount of time.   No other complaints.  Pt is taking xyzal daily.  Current Medications (verified): 1)  Xyzal 5 Mg  Tabs (Levocetirizine Dihydrochloride) .Marland Kitchen.. 1 By Mouth Once Daily 2)  Aciphex .Marland KitchenMarland Kitchen. 1 By Mouth Once Daily 3)  Prednisone 5 Mg Tabs (Prednisone) 4)  Westcort 0.2 % Oint (Hydrocortisone Valerate) .... Apply Two Times A Day 5)  Naftin 1 % Crea (Naftifine Hcl) .... Apply Once Daily 6)  Astepro 0.15 % Soln (Azelastine Hcl) .... 2 Sprays Each Nostril Once Daily 7)  Nasonex 50 Mcg/act Susp (Mometasone Furoate) .... 2 Sprays Each Nostril Once Daily  Allergies: 1)  ! Zithromax 2)  ! Depo-Medrol 3)  ! Darvocet 4)  ! Percocet  Past History:  Past medical, surgical, family and social histories (including risk factors) reviewed for relevance to current acute and chronic problems.  Past Medical History: Reviewed history from 08/06/2007 and no changes required. Allergic rhinitis G0P0 GERD  Past Surgical History: Reviewed history from 08/06/2007 and no changes required. no history of surgeries Tonsillectomy arthroscopic knees--b/l  Family History: Reviewed history from 08/06/2007 and no changes required. Family History of CAD Female 1st degree relative --F 30yo Family History Diabetes 1st degree  relative Family History High cholesterol Family History Hypertension Family History Ovarian cancer Family History Depression Family History of Anemia/FE deficiency  Social History: Reviewed history from 08/06/2007 and no changes required. 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  Otoscopic examination reveals clear canals, tympanic membranes are intact bilaterally without bulging, retraction, inflammation or discharge. Hearing is grossly normal bilaterally. Nose:  External nasal examination shows no deformity or inflammation. Nasal mucosa are pink and moist without lesions or exudates. Mouth:  postnasal drip.  No errythema Neck:  No deformities, masses, or tenderness noted. Lungs:  Normal respiratory effort, chest expands symmetrically. Lungs are clear to auscultation, no crackles or wheezes. Heart:  Normal rate and regular rhythm. S1 and S2 normal without gallop, murmur, click, rub or other extra sounds. Genitalia:  + errythema external vulva Extremities:  No clubbing, cyanosis, edema, or deformity noted with normal full range of motion of all joints.   Skin:  Intact without suspicious lesions or rashes Psych:  Oriented X3 and normally interactive.     Impression & Recommendations:  Problem # 1:  CANDIDIASIS OF VULVA AND VAGINA (ICD-112.1)  Her updated medication list for this problem  includes:    Naftin 1 % Crea (Naftifine hcl) .Marland Kitchen... Apply once daily  Orders: T-Chlamydia  Probe, urine [phone]) T-GC Probe, urine [phone])  Discussed treatment regimen and preventive measures.   Problem # 2:  URI (ICD-465.9)  The following medications were removed from the medication  list:    Naproxen 500 Mg Tabs (Naproxen) .Marland Kitchen... 1 tab by mouth two times a day x10 days and then as needed.  take w/ food. Her updated medication list for this problem includes:    Xyzal 5 Mg Tabs (Levocetirizine dihydrochloride) .Marland Kitchen... 1 by mouth once daily  Instructed on symptomatic treatment. Call if symptoms persist or worsen.   Complete Medication List: 1)  Xyzal 5 Mg Tabs (Levocetirizine dihydrochloride) .Marland Kitchen.. 1 by mouth once daily 2)  Aciphex  .Marland KitchenMarland Kitchen. 1 by mouth once daily 3)  Prednisone 5 Mg Tabs (Prednisone) 4)  Westcort 0.2 % Oint (Hydrocortisone valerate) .... Apply two times a day 5)  Naftin 1 % Crea (Naftifine hcl) .... Apply once daily 6)  Astepro 0.15 % Soln (Azelastine hcl) .... 2 sprays each nostril once daily 7)  Nasonex 50 Mcg/act Susp (Mometasone furoate) .... 2 sprays each nostril once daily Prescriptions: NAFTIN 1 % CREA (NAFTIFINE HCL) apply once daily  #30g x 1   Entered and Authorized by:   Loreen Freud DO   Signed by:   Loreen Freud DO on 12/21/2009   Method used:   Electronically to        [facility] # [id number]* (retail)       [address]       [place], [place]  [address]       Ph: [phone] or [phone]       Fax: [phone]   RxID:   [id number] WESTCORT 0.2 % OINT (HYDROCORTISONE VALERATE) apply two times a day  #30 g x 1   Entered and Authorized by:   Loreen Freud DO   Signed by:   Loreen Freud DO on 12/21/2009   Method used:   Electronically to        [facility] # [id number]* (retail)       [address]       [place], [place]  [address]       Ph: [phone] or [phone]       Fax: [phone]   RxID:   [id number]   Laboratory Results   Urine Tests    Routine Urinalysis   Color: yellow Appearance: Clear Glucose: negative   (Normal Range: Negative) Bilirubin: negative   (Normal Range: Negative) Ketone: negative   (Normal Range: Negative) Spec. Gravity: 1.020    (Normal Range: 1.003-1.035) Blood: negative   (Normal Range: Negative) pH: 6.5   (Normal Range: 5.0-8.0) Protein: negative   (Normal Range: Negative) Urobilinogen: 0.2   (Normal Range: 0-1) Nitrite: negative   (Normal Range: Negative) Leukocyte Esterace: negative   (Normal Range: Negative)    Comments: Army Fossa CMA  December 21, 2009 1:38 PM

## 2010-07-30 NOTE — Progress Notes (Signed)
Summary: Advise(lmom 3/3)  Phone Note Call from Patient   Caller: Patient Summary of Call: Pt called back and stated if she needs to do anything about the abnormality on her liver, she recently had her gallbladder removed? Army Fossa CMA  August 29, 2009 4:33 PM   Follow-up for Phone Call        I normally refer to GI for that.   I was going to get CT but since pt sees Dr Loreta Ave it was faxed to her to evaluate.    If she did not talk to pt about it and surgeon did not see it we can get CT to evaluate abnormality on liver on [place]. Follow-up by: Loreen Freud DO,  August 29, 2009 9:02 PM  Additional Follow-up for Phone Call Additional follow up Details #1::        LMTCB. Army Fossa CMA  August 30, 2009 11:08 AM  patient says she is aware of recommendations.Marland KitchenMarland KitchenMarland KitchenDoristine Devoid  August 31, 2009 9:03 AM

## 2010-07-30 NOTE — Progress Notes (Signed)
Summary: FYI  Phone Note Call from Patient   Summary of Call: Pt called and stated that she felt some relief when she left the office but she is starting to feel tightness again. I told pt to start her inhalers as directed and if she continued to feel this way to give [place] a call. But to give everything some time to get into her system. Army Fossa CMA  August 29, 2009 1:06 PM

## 2010-07-30 NOTE — Assessment & Plan Note (Signed)
Summary: CPX AND FASTING LABS --NO PAP////SPH   Vital Signs:  Patient profile:   30 year old female Height:      70.5 inches Weight:      251 pounds Temp:     98.5 degrees F oral Pulse rate:   78 / minute Resp:     18 per minute BP sitting:   110 / 78  (left arm)  Vitals Entered By: Jeremy Johann CMA (January 02, 2010 9:03 AM) CC: CPX Comments --FASTING --NO PAP REVIEWED MED LIST, PATIENT AGREED DOSE AND INSTRUCTION CORRECT    History of Present Illness: Pt here for cpe and labs --no complaints.     Preventive Screening-Counseling & Management  Alcohol-Tobacco     Alcohol drinks/day: <1     Alcohol type: wine, cosmos     Smoking Status: never     Passive Smoke Exposure: no  Caffeine-Diet-Exercise     Caffeine use/day: 2     Does Patient Exercise: yes     Type of exercise: pool     Exercise (avg: min/session): 30-60     Times/week: <3  Hep-HIV-STD-Contraception     HIV Risk: no     Dental Visit-last 6 months yes     Dental Care Counseling: not indicated; dental care within six months     SBE monthly: yes     SBE Education/Counseling: not indicated; SBE done regularly  Safety-Violence-Falls     Seat Belt Use: 100      Sexual History:  single.        Drug Use:  never.    Current Medications (verified): 1)  Xyzal 5 Mg  Tabs (Levocetirizine Dihydrochloride) .Marland Kitchen.. 1 By Mouth Once Daily 2)  Aciphex .Marland KitchenMarland Kitchen. 1 By Mouth Once Daily 3)  Westcort 0.2 % Oint (Hydrocortisone Valerate) .... Apply Two Times A Day 4)  Naftin 1 % Crea (Naftifine Hcl) .... Apply Once Daily 5)  Astepro 0.15 % Soln (Azelastine Hcl) .... 2 Sprays Each Nostril Once Daily 6)  Nasonex 50 Mcg/act Susp (Mometasone Furoate) .... 2 Sprays Each Nostril Once Daily  Allergies: 1)  ! Zithromax 2)  ! Depo-Medrol 3)  ! Darvocet 4)  ! 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 Family History of CAD Female 1st degree relative --F 30yo Family History Diabetes 1st degree relative Family History High cholesterol Family History Hypertension Family History Ovarian cancer Family History Depression Family History of Anemia/FE deficiency  Social History: Reviewed history from 08/06/2007 and no changes required. Occupation: teacher-business Single Never Smoked Alcohol use-yes Drug use-no Regular exercise-no Does Patient Exercise:  yes Dental Care w/in 6 mos.:  yes Sexual History:  single Drug Use:  never  Review of Systems      See HPI General:  Denies chills, fatigue, fever, loss of appetite, malaise, sleep disorder, sweats, weakness, and weight loss.  Eyes:  Denies blurring, discharge, double vision, eye irritation, eye pain, halos, itching, light sensitivity, red eye, vision loss-1 eye, and vision loss-both eyes; optho-- + contacts--q1y. ENT:  Denies decreased hearing, difficulty swallowing, ear discharge, earache, hoarseness, nasal congestion, nosebleeds, postnasal drainage, ringing in ears, sinus pressure, and sore throat. CV:  Denies bluish discoloration of lips or nails, chest pain or discomfort, difficulty breathing at  night, difficulty breathing while lying down, fainting, fatigue, leg cramps with exertion, lightheadness, near fainting, palpitations, shortness of breath with exertion, swelling of feet, swelling of hands, and weight gain. Resp:  Denies chest discomfort, chest pain with inspiration, cough, coughing up blood, excessive snoring, hypersomnolence, morning headaches, pleuritic, shortness of breath, sputum productive, and wheezing. GI:  Denies abdominal pain, bloody stools, change in bowel habits, constipation, dark tarry stools, diarrhea, excessive appetite, gas, hemorrhoids, and indigestion. GU:  Denies abnormal vaginal bleeding, decreased libido, discharge, dysuria, genital sores, hematuria, incontinence, nocturia, urinary frequency, and urinary hesitancy. MS:  Denies joint pain, joint redness, joint swelling, loss of strength, low back pain, mid back pain, muscle aches, muscle , cramps, muscle weakness, stiffness, and thoracic pain. Derm:  Denies changes in color of skin, changes in nail beds, dryness, excessive perspiration, flushing, hair loss, insect bite(s), itching, lesion(s), poor wound healing, and rash. Neuro:  Denies brief paralysis, difficulty with concentration, disturbances in coordination, falling down, headaches, inability to speak, memory loss, numbness, poor balance, seizures, sensation of room spinning, tingling, tremors, visual disturbances, and weakness. Psych:  Denies alternate hallucination ( auditory/visual), anxiety, depression, easily angered, easily tearful, irritability, mental problems, panic attacks, sense of great danger, suicidal thoughts/plans, thoughts of violence, unusual visions or sounds, and thoughts /plans of harming others. Endo:  Denies cold intolerance, excessive hunger, excessive thirst, excessive urination, heat intolerance, polyuria, and weight change. Heme:  Denies abnormal bruising, bleeding, enlarge lymph nodes, fevers, pallor, and skin discoloration. Allergy:   Denies hives or rash, itching eyes, persistent infections, seasonal allergies, and sneezing.  Physical Exam  General:  Well-developed,well-nourished,in no acute distress; alert,appropriate and cooperative throughout examination Head:  Normocephalic and atraumatic without obvious abnormalities. No apparent alopecia or balding. Eyes:  vision grossly intact, pupils equal, pupils round, pupils reactive to light, and no injection.   Ears:  External ear exam shows no significant lesions or deformities.  Otoscopic examination reveals clear canals, tympanic membranes are intact bilaterally without bulging, retraction, inflammation or discharge. Hearing is grossly normal bilaterally. Nose:  External nasal examination shows no deformity or inflammation. Nasal mucosa are pink and moist without lesions or exudates. Mouth:  Oral mucosa and oropharynx without lesions or exudates.  Teeth in good repair. Neck:  No deformities, masses, or tenderness noted. Breasts:  gyn Lungs:  Normal respiratory effort, chest expands symmetrically. Lungs are clear to auscultation, no crackles or wheezes. Heart:  normal rate and no murmur.   Abdomen:  Bowel sounds positive,abdomen soft and non-tender without masses, organomegaly or hernias noted. Rectal:  gyn Genitalia:  gyn Msk:  normal ROM, no joint tenderness, no joint swelling, no joint warmth, no redness over joints, no joint deformities, no joint instability, and no crepitation.   Pulses:  R posterior tibial normal, R dorsalis pedis normal, R carotid normal, L posterior tibial normal, L dorsalis pedis normal, and L carotid normal.   Extremities:  No clubbing, cyanosis, edema, or deformity noted with normal full range of motion of all joints.   Neurologic:  No cranial nerve deficits noted. Station and gait are normal. Plantar reflexes are down-going  bilaterally. DTRs are symmetrical throughout. Sensory, motor and coordinative functions appear intact. Skin:  Intact without  suspicious lesions or rashes Cervical Nodes:  No lymphadenopathy noted Axillary Nodes:  No palpable lymphadenopathy Psych:  Cognition and judgment appear intact. Alert and cooperative with normal attention span and concentration. No apparent delusions, illusions, hallucinations   Impression & Recommendations:  Problem # 1:  PREVENTIVE HEALTH CARE (ICD-V70.0) ghm utd  Orders: Venipuncture (78295) TLB-Lipid Panel (80061-LIPID) TLB-BMP (Basic Metabolic Panel-BMET) (80048-METABOL) TLB-CBC Platelet - w/Differential (85025-CBCD) TLB-Hepatic/Liver Function Pnl (80076-HEPATIC) TLB-TSH (Thyroid Stimulating Hormone) (84443-TSH) T- * Misc. Laboratory test [phone]) T- * Misc. 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 1 by mouth once daily 2)  Aciphex  .Marland KitchenMarland Kitchen. 1 by mouth once daily 3)  Westcort 0.2 % Oint (Hydrocortisone valerate) .... Apply two times a day 4)  Naftin 1 % Crea (Naftifine hcl) .... Apply once daily 5)  Astepro 0.15 % Soln (Azelastine hcl) .... 2 sprays each nostril once daily 6)  Nasonex 50 Mcg/act Susp (Mometasone furoate) .... 2 sprays each nostril once daily  Other Orders: Tdap => 69yrs IM (46962) Admin 1st Vaccine (95284) Admin 1st Vaccine [facility]) [phone])  Patient Instructions: 1)  Please schedule a follow-up appointment in 1 year.  Prescriptions: XYZAL 5 MG  TABS (LEVOCETIRIZINE DIHYDROCHLORIDE) 1 by mouth once daily  #30 Tablet x 11   Entered and Authorized by:   Loreen Freud DO   Signed by:   Loreen Freud DO on 01/02/2010   Method used:   Electronically to        [id number] (retail)       [address]       [place], [place]  [address]       Ph: [phone] or  [phone]       Fax: [phone]   RxID:   [id number]    Tetanus/Td Vaccine    Vaccine Type: Tdap    Site: right deltoid    Mfr: [email or web address]    Dose: 0.5 ml    Route: IM    Given by: Jeremy Johann CMA    Exp. Date: 09/22/2011    Lot #: [id number]    VIS given: 05/18/07 version given January 02, 2010.  Last Flu Vaccine:  Fluvax Non-MCR (05/12/2007 11:17:53 AM) Flu Vaccine Result Date:  04/11/2009 Flu Vaccine Result:  given Flu Vaccine Next Due:  1 yr Last TD:  Td (01/09/1999 9:00:13 AM) TD Result Date:  01/02/2010 TD Result:  given TD Next Due:  10 yr Last PAP:  Normal (04/19/2008 8:57:43 AM) PAP Result Date:  05/09/2009 PAP Result:  normal PAP Next Due:  1 yr    Appended Document: CPX AND FASTING LABS --NO PAP////SPH  Laboratory Results   Urine Tests   Date/Time Reported: January 02, 2010 10:12 AM   Routine Urinalysis   Color: yellow Appearance: Clear Glucose: negative   (Normal Range: Negative) Bilirubin: negative   (Normal Range: Negative) Ketone: negative   (Normal Range: Negative) Spec. Gravity: 1.015   (Normal Range: 1.003-1.035) Blood: negative   (Normal Range: Negative) pH: 7.5   (Normal Range: 5.0-8.0) Protein: negative   (Normal Range: Negative) Urobilinogen: negative   (  Normal Range: 0-1) Nitrite: negative   (Normal Range: Negative) Leukocyte Esterace: negative   (Normal Range: Negative)    Comments: Floydene Flock  January 02, 2010 10:12 AM

## 2010-07-30 NOTE — Assessment & Plan Note (Signed)
Summary: throwing up bile//lch   Vital Signs:  Patient profile:   30 year old female Menstrual status:  regular LMP:     02/19/2010 Weight:      234 pounds Temp:     98.9 degrees F oral Pulse rate:   76 / minute Pulse rhythm:   regular BP sitting:   118 / 74  (right arm)  Vitals Entered By: Almeta Monas CMA Duncan Dull) (March 08, 2010 11:00 AM) CC: c/o nausea, vomiting and right sided abdominal pain LMP (date): 02/19/2010     Menstrual Status regular Enter LMP: 02/19/2010 Last PAP Result normal   History of Present Illness: Pt here c/o NV in am for 3 days ---pt has vomiting in am and will have nausea during day with no vomiting and is able to eat during day.  Pt does have some RUQ abd pain.  No fevers.    Current Medications (verified): 1)  Xyzal 5 Mg  Tabs (Levocetirizine Dihydrochloride) .Marland Kitchen.. 1 By Mouth Once Daily 2)  Aciphex .Marland KitchenMarland Kitchen. 1 By Mouth Once Daily 3)  Astepro 0.15 % Soln (Azelastine Hcl) .... 2 Sprays Each Nostril Once Daily 4)  Nasonex 50 Mcg/act Susp (Mometasone Furoate) .... 2 Sprays Each Nostril Once Daily 5)  Beyaz 3-0.02-0.451 Mg Tabs (Drospiren-Eth Estrad-Levomefol) .... As Directed 6)  Claritin  (Loratadine) .... Once Daily 7)  Mobic 15 Mg Tabs (Meloxicam) .Marland Kitchen.. 1 By Mouth Once Daily As Needed Pain 8)  Vitamin D3 2000 Unit Caps (Cholecalciferol) .... Qd 9)  Promethazine Hcl 25 Mg Tabs (Promethazine Hcl) .Marland Kitchen.. 1 By Mouth Qid As Needed Nausea Vomiting  Allergies (verified): 1)  ! Zithromax 2)  ! Depo-Medrol 3)  ! Darvocet 4)  ! Percocet  Past History:  Past medical, surgical, family and social histories (including risk factors) reviewed for relevance to current acute and chronic problems.  Past Medical History: Reviewed history from 08/06/2007 and no changes required. Allergic rhinitis G0P0 GERD  Past Surgical History: Reviewed history from 01/02/2010 and no changes required. Tonsillectomy arthroscopic knees--b/l Cholecystectomy  (08/23/2009)  Family History: Reviewed history from 08/06/2007 and no changes required. Family History of CAD Female 1st degree relative --F 30yo Family History Diabetes 1st degree relative Family History High cholesterol Family History Hypertension Family History Ovarian cancer Family History Depression Family History of Anemia/FE deficiency  Social History: Reviewed history from 08/06/2007 and no changes required. 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  Teeth in good repair. Abdomen:  Bowel sounds positive,abdomen soft and non-tender without masses, organomegaly or hernias noted. Psych:  Cognition and judgment appear intact. Alert and cooperative with normal attention span and concentration. No apparent delusions, illusions, hallucinations   Impression & Recommendations:  Problem # 1:  NAUSEA AND VOMITING (ICD-787.01)  Orders: Venipuncture (16109) TLB-BMP (Basic Metabolic Panel-BMET) (80048-METABOL) TLB-CBC Platelet - w/Differential (85025-CBCD) TLB-Hepatic/Liver Function Pnl (80076-HEPATIC) TLB-Amylase (82150-AMYL) TLB-Lipase (83690-LIPASE) UA Dipstick w/o Micro (manual) (60454) Admin of Therapeutic Inj  intramuscular or subcutaneous (09811) Promethazine up to 50mg  (J2550) Specimen Handling (99000)  Complete Medication List: 1)  Xyzal 5 Mg Tabs (Levocetirizine dihydrochloride) .Marland Kitchen.. 1 by mouth once daily 2)  Aciphex  .Marland KitchenMarland Kitchen. 1 by mouth once daily 3)  Astepro 0.15 % Soln (Azelastine hcl) .... 2 sprays each nostril once daily 4)  Nasonex 50 Mcg/act Susp (Mometasone furoate) .... 2 sprays each nostril once daily 5)  Beyaz 3-0.02-0.451 Mg Tabs (Drospiren-eth estrad-levomefol) .... As  directed 6)  Claritin (loratadine)  .... Once daily 7)  Mobic 15 Mg Tabs (Meloxicam) .Marland Kitchen.. 1 by mouth once daily as needed pain 8)  Vitamin D3 2000 Unit Caps (Cholecalciferol) .... Qd 9)  Promethazine Hcl 25 Mg Tabs (Promethazine hcl) .Marland Kitchen.. 1 by mouth qid as needed nausea vomiting  Patient Instructions: 1)  if vomiting con't go to ER Prescriptions: PROMETHAZINE HCL 25 MG TABS (PROMETHAZINE HCL) 1 by mouth qid as needed nausea vomiting  #30 x 0   Entered and Authorized by:   Loreen Freud DO   Signed by:   Loreen Freud DO on 03/08/2010   Method used:   Electronically to        [id number]* (retail)       [address]       [place], [place]  [address]       Ph: [phone] or [phone]       Fax: [phone]   RxID:   [id number]   Laboratory Results   Urine Tests      Urine HCG: negative      Medication Administration  Injection # 1:    Medication: Promethazine up to 50mg     Diagnosis: NAUSEA AND VOMITING (ICD-787.01)    Route: IM    Site: L deltoid    Exp Date: 10/29/2010    Lot #: [id number]    Mfr: [email or web address]    Patient tolerated injection without complications    Given by: Almeta Monas CMA Duncan Dull) (March 08, 2010 11:25 AM)  Orders Added: 1)  Venipuncture [27062] 2)  TLB-BMP (Basic Metabolic Panel-BMET) [80048-METABOL] 3)  TLB-CBC Platelet - w/Differential [85025-CBCD] 4)  TLB-Hepatic/Liver Function Pnl [80076-HEPATIC] 5)  TLB-Amylase [82150-AMYL] 6)  TLB-Lipase [83690-LIPASE] 7)  Est. Patient Level III [37628] 8)  UA Dipstick w/o Micro (manual) [81002] 9)  Admin of Therapeutic Inj  intramuscular or subcutaneous [96372] 10)  Promethazine up to 50mg  [J2550] 11)  Specimen Handling [99000]

## 2010-07-30 NOTE — Letter (Signed)
Summary: Unable To Reach-Consult Scheduled  [facility] [place]  [address] [place], [place] [address]   Phone: [phone]  Fax: [phone]    08/27/2009 MRN: [id number]    Dear Ms. Pizano,   We have been unable to reach you by phone.  Please contact our office with an updated phone number.      Thank you,  Army Fossa CMA  August 27, 2009 5:07 PM

## 2010-07-30 NOTE — Assessment & Plan Note (Signed)
Summary: skin feels like it is burning//kn   Vital Signs:  Patient profile:   30 year old female Height:      70.5 inches Weight:      252 pounds Temp:     98.2 degrees F oral Pulse rate:   72 / minute BP sitting:   118 / 78  (left arm)  Vitals Entered By: Jeremy Johann CMA (January 09, 2010 10:58 AM) CC: SKIN BURNING ALL OVER Comments REVIEWED MED LIST, PATIENT AGREED DOSE AND INSTRUCTION CORRECT    History of Present Illness: Pt here c/o burning on skin on arms, chest, back---pt just had steroids for allergic reaction.   No fever--no other complaints.  Current Medications (verified): 1)  Xyzal 5 Mg  Tabs (Levocetirizine Dihydrochloride) .Marland Kitchen.. 1 By Mouth Once Daily 2)  Aciphex .Marland KitchenMarland Kitchen. 1 By Mouth Once Daily 3)  Astepro 0.15 % Soln (Azelastine Hcl) .... 2 Sprays Each Nostril Once Daily 4)  Nasonex 50 Mcg/act Susp (Mometasone Furoate) .... 2 Sprays Each Nostril Once Daily 5)  Balziva 0.4-35 Mg-Mcg Tabs (Norethindrone-Eth Estradiol) .... As Directed  Allergies: 1)  ! Zithromax 2)  ! Depo-Medrol 3)  ! Darvocet 4)  ! Percocet  Past History:  Past medical, surgical, family and social histories (including risk factors) reviewed for relevance to current acute and chronic problems.  Past Medical History: Reviewed history from 08/06/2007 and no changes required. Allergic rhinitis G0P0 GERD  Past Surgical History: Reviewed history from 01/02/2010 and no changes required. Tonsillectomy arthroscopic knees--b/l Cholecystectomy (08/23/2009)  Family History: Reviewed history from 08/06/2007 and no changes required. Family History of CAD Female 1st degree relative --F 30yo Family History Diabetes 1st degree relative Family History High cholesterol Family History Hypertension Family History Ovarian cancer Family History Depression Family History of Anemia/FE deficiency  Social History: Reviewed history from 08/06/2007 and no changes required. 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 Nasal mucosa are pink and moist without lesions or exudates. Mouth:  Oral mucosa and oropharynx without lesions or exudates.  Teeth in good repair. Skin:  Intact without suspicious lesions or rashes + hyper graphia Psych:  Cognition and judgment appear intact. Alert and cooperative with normal attention span and concentration. No apparent delusions, illusions, hallucinations   Impression & Recommendations:  Problem # 1:  ALLERGIC ASTHMA (ICD-493.00)  Orders: Allergy Referral  (Allergy)  Problem # 2:  CONTACT DERMATITIS&OTHER ECZEMA DUE UNSPEC CAUSE (ICD-692.9)  Her updated medication list for this problem includes:    Xyzal 5 Mg Tabs (Levocetirizine dihydrochloride) .Marland Kitchen... 1 by mouth once daily    zantac 150 two times a day  refer to allergist  Discussed avoidance of triggers and symptomatic treatment.   Complete Medication List: 1)  Xyzal 5 Mg Tabs (Levocetirizine dihydrochloride) .Marland Kitchen.. 1 by mouth once daily 2)  Aciphex  .Marland KitchenMarland Kitchen. 1 by mouth once daily 3)  Astepro 0.15 % Soln (Azelastine hcl) .... 2 sprays each nostril once daily 4)  Nasonex 50 Mcg/act Susp (Mometasone furoate) .... 2 sprays each nostril once daily 5)  Balziva 0.4-35 Mg-mcg Tabs (Norethindrone-eth estradiol) .... As directed  Patient Instructions: 1)  con't xyzal 2)  add zantac 150 mg two times a day  3)  if you have any trouble breathing etc--go to ER

## 2010-07-30 NOTE — Assessment & Plan Note (Signed)
Summary: MUSCLE SPASMS IN FEET GOING UP LEG--ALTERNATING LEGS////SPH   Vital Signs:  Patient profile:   30 year old female Height:      69 inches Weight:      245.25 pounds BMI:     36.35 Pulse rate:   62 / minute BP sitting:   120 / 70  Vitals Entered By: Kandice Hams (October 25, 2009 4:10 PM) CC: c/o muscle spasm both feet   History of Present Illness: 30 yo woman here today for bilateral muscle spasms in feet.  reports she had gallbladder removed and sxs initially improved but now again having muscle spasms in feet and neck.  L foot cramp started on top of foot and travelled up the back of the leg.  R foot cramp starts in the arch of the foot and goes up the foot.  can occur at rest.  no pain w/ the first step.  admits to decreased fluid intake the last few days.  cramps started on Friday.  has been wearing flip-flops and flats, no sneakers.  carries heavy bags of books and papers (is a Runner, broadcasting/film/video), slumps forward.  Allergies (verified): 1)  ! Zithromax 2)  ! Depo-Medrol 3)  ! Darvocet 4)  ! Percocet  Past History:  Social History: Last updated: 08/06/2007 Occupation: teacher-business Single Never Smoked Alcohol use-yes Drug use-no Regular exercise-no  Review of Systems General:  Denies chills, fatigue, fever, malaise, and weakness. MS:  Complains of cramps; denies joint pain, joint redness, joint swelling, loss of strength, and muscle weakness. Neuro:  Denies numbness, poor balance, and tingling.  Physical Exam  General:  Well-developed,well-nourished,in no acute distress; alert,appropriate and cooperative throughout examination Msk:  bilateral trapezius spasm  L foot- no TTP along plantar fascia, no pain w/ palpation of dorsum of foot, no pain along malleoli or achilles  R foot- + TTP along plantar fascia, worst at insertion on calcaneus Pulses:  +2 DP/PT Extremities:  no C/C/E Neurologic:  strength normal in all extremities, sensation intact to light touch, and  gait normal.     Impression & Recommendations:  Problem # 1:  PLANTAR FASCIITIS (ICD-728.71) Assessment New pt's R foot pain consistent w/ plantar fasciitis and not a cramp.  showed pt exercises and encouraged ice.  will start scheduled NSAIDs.  discussed cushioned shoes w/ good arch support.  Pt expresses understanding and is in agreement w/ this plan. Her updated medication list for this problem includes:    Naproxen 500 Mg Tabs (Naproxen) .Marland Kitchen... 1 tab by mouth two times a day x10 days and then as needed.  take w/ food.  Problem # 2:  MUSCLE CRAMPS, FOOT (ICD-729.82) Assessment: New pt's L foot pain consistent w/ cramp.  pt concerned about electrolyte imbalance.  will check BMP to assess K+.  encouraged increased fluid intake. Orders: Venipuncture ([id number])  Problem # 3:  MUSCLE SPASM, TRAPEZIUS (ICD-728.85) Assessment: New pt w/ poor posture, large breasts, and often carries heavy loads- all contributing to bilateral trapezius spasm.  discussed improved posture, limiting heavy lifting.  start NSAIDs, heating pad.  Complete Medication List: 1)  Xyzal 5 Mg Tabs (Levocetirizine dihydrochloride) .Marland Kitchen.. 1 by mouth once daily 2)  Aciphex  .Marland KitchenMarland Kitchen. 1 by mouth once daily 3)  Naproxen 500 Mg Tabs (Naproxen) .Marland Kitchen.. 1 tab by mouth two times a day x10 days and then as needed.  take w/ food.  Patient Instructions: 1)  Please schedule a follow-up appointment as needed.  2)  Drink plenty of fluids 3)  Eat potassium rich foods- bananas, OJ, fruits, veggies 4)  Roll your foot over the ice bottle at least 2x/day 5)  Do the stretches 2-3x/day 6)  Take the Naproxen as directed for 10 days and then as needed.  take w/ food 7)  Call with any questions or concerns 8)  Hang in there!!! Prescriptions: NAPROXEN 500 MG TABS (NAPROXEN) 1 tab by mouth two times a day x10 days and then as needed.  take w/ food.  #60 x 0   Entered and Authorized by:   Neena Rhymes MD   Signed by:   Neena Rhymes MD on  10/25/2009   Method used:   Electronically to        [id number] (retail)       [address]       [place], [place]  [address]       Ph: [phone] or [phone]       Fax: [phone]   RxID:   [id number]

## 2010-07-30 NOTE — Letter (Signed)
Summary: [facility]   Imported By: Lanelle Bal 09/26/2009 13:15:21  _____________________________________________________________________  External Attachment:    Type:   Image     Comment:   External Document

## 2010-07-30 NOTE — Progress Notes (Signed)
Summary: allergic to vitamin d capsule  Phone Note Call from Patient Call back at [phone]   Caller: Patient Summary of Call: patient called left msg she went to pick up prescription for vitamin d and she is allergic to the capsule gel coating and was told to inquire about vitamin d3 shots pls advise and what she should do since she is allergic to this med. Initial call taken by: Doristine Devoid CMA,  February 05, 2010 3:16 PM  Follow-up for Phone Call        she can get vita D 3 tab otc 2000u daily recheck 3 months Follow-up by: Loreen Freud DO,  February 05, 2010 3:21 PM  Additional Follow-up for Phone Call Additional follow up Details #1::        Pt is already taking Vitamin d3 2000u daily- does she need more than that? Army Fossa CMA  February 05, 2010 4:28 PM     Additional Follow-up for Phone Call Additional follow up Details #2::    she can take 2 a day of the 2000u ------recheck 1 month Follow-up by: Loreen Freud DO,  February 05, 2010 4:45 PM  Additional Follow-up for Phone Call Additional follow up Details #3:: Details for Additional Follow-up Action Taken: DISCUSS WITH PATIENT.................Marland KitchenFelecia Deloach CMA  February 05, 2010 5:05 PM   New/Updated Medications: VITAMIN D3 2000 UNIT CAPS (CHOLECALCIFEROL) qd

## 2010-07-30 NOTE — Letter (Signed)
[facility]   Imported By: Lennie Odor 01/23/2010 14:05:54  _____________________________________________________________________  External Attachment:    Type:   Image     Comment:   External Document

## 2010-07-30 NOTE — Progress Notes (Signed)
Summary: lab results  Phone Note Outgoing Call   Call placed by: [facility] CMA,  December 25, 2009 12:14 PM Details for Reason: Chlamydia Probe Amp, Urine                             NEGATIVE Summary of Call: left message to call office.............Marland KitchenFelecia Deloach CMA  December 25, 2009 12:14 PM  left message to call office .................Marland KitchenFelecia Deloach CMA  December 26, 2009 4:45 PM   Follow-up for Phone Call        Patient aware Follow-up by: Shonna Chock,  December 26, 2009 5:02 PM

## 2010-07-30 NOTE — Op Note (Signed)
Summary: [facility]olecyst[facility]  Laparoscopic Cholecystectomy/[facility]   Imported By: Lanelle Bal 09/03/2009 11:14:31  _____________________________________________________________________  External Attachment:    Type:   Image     Comment:   External Document

## 2010-09-12 LAB — COMPREHENSIVE METABOLIC PANEL
Albumin: 3.9 g/dL (ref 3.5–5.2)
BUN: 5 mg/dL — ABNORMAL LOW (ref 6–23)
Chloride: 106 mEq/L (ref 96–112)
Creatinine, Ser: 0.74 mg/dL (ref 0.4–1.2)
Glucose, Bld: 79 mg/dL (ref 70–99)
Total Bilirubin: 0.6 mg/dL (ref 0.3–1.2)
Total Protein: 7.6 g/dL (ref 6.0–8.3)

## 2010-09-12 LAB — URINE MICROSCOPIC-ADD ON

## 2010-09-12 LAB — DIFFERENTIAL
Basophils Absolute: 0.1 10*3/uL (ref 0.0–0.1)
Eosinophils Absolute: 0 10*3/uL (ref 0.0–0.7)
Lymphocytes Relative: 17 % (ref 12–46)
Monocytes Absolute: 0.5 10*3/uL (ref 0.1–1.0)
Neutro Abs: 4.2 10*3/uL (ref 1.7–7.7)
Neutrophils Relative %: 74 % (ref 43–77)

## 2010-09-12 LAB — CBC
MCH: 31.4 pg (ref 26.0–34.0)
MCV: 93.8 fL (ref 78.0–100.0)
Platelets: 230 10*3/uL (ref 150–400)
RDW: 13.9 % (ref 11.5–15.5)

## 2010-09-12 LAB — URINALYSIS, ROUTINE W REFLEX MICROSCOPIC
Nitrite: NEGATIVE
Specific Gravity, Urine: 1.016 (ref 1.005–1.030)
Urobilinogen, UA: 0.2 mg/dL (ref 0.0–1.0)

## 2010-09-13 LAB — DIFFERENTIAL
Basophils Absolute: 0 10*3/uL (ref 0.0–0.1)
Basophils Relative: 0 % (ref 0–1)
Eosinophils Absolute: 0 10*3/uL (ref 0.0–0.7)
Eosinophils Relative: 0 % (ref 0–5)
Neutrophils Relative %: 64 % (ref 43–77)

## 2010-09-13 LAB — CBC
MCH: 32 pg (ref 26.0–34.0)
MCHC: 34.3 g/dL (ref 30.0–36.0)
MCV: 93.1 fL (ref 78.0–100.0)
Platelets: 267 10*3/uL (ref 150–400)
RBC: 4.46 MIL/uL (ref 3.87–5.11)
RDW: 13.9 % (ref 11.5–15.5)

## 2010-09-13 LAB — SEDIMENTATION RATE: Sed Rate: 41 mm/hr — ABNORMAL HIGH (ref 0–22)

## 2010-09-13 LAB — BASIC METABOLIC PANEL
BUN: 5 mg/dL — ABNORMAL LOW (ref 6–23)
CO2: 25 mEq/L (ref 19–32)
Calcium: 9.4 mg/dL (ref 8.4–10.5)
Chloride: 105 mEq/L (ref 96–112)
Creatinine, Ser: 0.79 mg/dL (ref 0.4–1.2)
GFR calc Af Amer: >60 mL/min (ref >60)
GFR calc non Af Amer: >60 mL/min (ref >60)
Glucose, Bld: 90 mg/dL (ref 70–99)
Potassium: 3.8 mEq/L (ref 3.5–5.1)
Sodium: 138 mEq/L (ref 135–145)

## 2010-09-20 LAB — CK: Total CK: 109 U/L (ref 7–177)

## 2010-09-20 LAB — TROPONIN I: Troponin I: 0.02 ng/mL (ref 0.00–0.06)

## 2010-09-20 LAB — POCT I-STAT, CHEM 8
Calcium, Ion: 0.96 mmol/L — ABNORMAL LOW (ref 1.12–1.32)
Hemoglobin: 15 g/dL (ref 12.0–15.0)
Sodium: 136 mEq/L (ref 135–145)
TCO2: 22 mmol/L (ref 0–100)

## 2010-12-04 ENCOUNTER — Ambulatory Visit: Payer: Self-pay | Admitting: Family Medicine

## 2010-12-05 ENCOUNTER — Encounter: Payer: Self-pay | Admitting: Family Medicine

## 2010-12-05 ENCOUNTER — Ambulatory Visit (INDEPENDENT_AMBULATORY_CARE_PROVIDER_SITE_OTHER): Payer: BC Managed Care – PPO | Admitting: Family Medicine

## 2010-12-05 VITALS — BP 116/80 | HR 75 | Temp 99.9°F | Wt 244.2 lb

## 2010-12-05 DIAGNOSIS — M791 Myalgia, unspecified site: Secondary | ICD-10-CM

## 2010-12-05 DIAGNOSIS — E559 Vitamin D deficiency, unspecified: Secondary | ICD-10-CM | POA: Insufficient documentation

## 2010-12-05 DIAGNOSIS — IMO0001 Reserved for inherently not codable concepts without codable children: Secondary | ICD-10-CM

## 2010-12-05 MED ORDER — CHOLECALCIFEROL 50 MCG (2000 UT) PO CAPS: ORAL_CAPSULE | ORAL | Status: DC

## 2010-12-05 NOTE — Patient Instructions (Signed)
IMPORTANT: HOW TO USE THIS INFORMATION:  This is a summary and does NOT have all possible information about this product. This information does not assure that this product is safe, effective, or appropriate for you. This information is not individual medical advice and does not substitute for the advice of your health care professional. Always ask your health care professional for complete information about this product and your specific health needs.    VITAMIN D - ORAL    USES:  Vitamin D (ergocalciferol-D2, cholecalciferol-D3, alfacalcidol) is a fat-soluble vitamin that helps your body absorb calcium and phosphorus. Having the right amount of vitamin D, calcium, and phosphorus is important for building and keeping strong bones. Vitamin D is used to treat and prevent bone disorders (such as rickets, osteomalacia). Vitamin D is made by the body when skin is exposed to sunlight. Sunscreen, protective clothing, limited exposure to sunlight, dark skin, and age may prevent getting enough vitamin D from the sun. Vitamin D with calcium is used to treat or prevent bone loss (osteoporosis). Vitamin D is also used with other medications to treat low levels of calcium or phosphate caused by certain disorders (such as hypoparathyroidism, pseudohypoparathyroidism, familial hypophosphatemia). It may be used in kidney disease to keep calcium levels normal and allow normal bone growth. Vitamin D drops (or other supplements) are given to breast-fed infants because breast milk usually has low levels of vitamin D.    HOW TO USE:  Take vitamin D by mouth, usually once daily. Vitamin D is best absorbed when taken after a meal but may be taken with or without food. Alfacalcidol is usually taken with food. Follow all directions on the product package. If you are uncertain about any of the information, consult your doctor or pharmacist. If your doctor has prescribed this medication, take as directed by your doctor. Your dosage is  based on your medical condition, amount of sun exposure, diet, age, and response to treatment. Measure the liquid medication with the dropper provided, or use a medication-measuring spoon/device to make sure you have the correct dose. If you are taking the chewable tablet, chew the medication thoroughly before swallowing. Certain medications (bile acid sequestrants such as cholestyramine/colestipol, mineral oil, orlistat) can decrease the absorption of vitamin D. Take your doses of these medications as far as possible from your doses of vitamin D (at least 2 hours apart, longer if possible). It may be easiest to take vitamin D at bedtime if you are also taking these other medications. Ask your doctor or pharmacist how long you should wait between doses and for help finding a dosing schedule that will work with all your medications. Take this medication regularly to get the most benefit from it. To help you remember, take it at the same time each day. If your doctor has recommended that you follow a special diet (such as a diet high in calcium), it is very important to follow the diet to get the most benefit from this medication and to prevent serious side effects. Do not take other supplements/vitamins unless ordered by your doctor. If you think you may have a serious medical problem, get medical help right away.    SIDE EFFECTS:  Vitamin D at normal doses usually has no side effects. If you have any unusual effects, contact your doctor or pharmacist promptly. If your doctor has directed you to take this medication, remember that he or she has judged that the benefit to you is greater than the risk of side  effects. Many people using this medication do not have serious side effects. Too much vitamin D can cause harmful high calcium levels. Tell your doctor right away if any of these signs of high vitamin D/calcium levels occur: nausea/vomiting, constipation, loss of appetite, increased thirst, increased urination,  mental/mood changes, unusual tiredness. A very serious allergic reaction to this drug is rare. However, get medical help right away if you notice any symptoms of a serious allergic reaction, including: rash, itching/swelling (especially of the face/tongue/throat), severe dizziness, trouble breathing. This is not a complete list of possible side effects. If you notice other effects not listed above, contact your doctor or pharmacist. In the [place] - Call your doctor for medical advice about side effects. You may report side effects to FDA at 1-800-FDA-1088. In [place] - Call your doctor for medical advice about side effects. You may report side effects to Health [place] at [phone].    PRECAUTIONS:  Before taking vitamin D, tell your doctor or pharmacist if you are allergic to it; or if you have any other allergies. This product may contain inactive ingredients, which can cause allergic reactions or other problems. Talk to your pharmacist for more details. Before using this medication, tell your doctor or pharmacist your medical history, especially of: high calcium/vitamin D levels (hypercalcemia/hypervitaminosis D), difficulty absorbing nutrition from food (malabsorption syndrome), kidney disease, liver disease. Before having surgery, tell your doctor or dentist about all the products you use (including prescription drugs, nonprescription drugs, and herbal products). During pregnancy, doses of vitamin D greater than the recommended dietary allowance should be used only when clearly needed. Discuss the risks and benefits with your doctor. This medication passes into breast milk. Consult your doctor before breast-feeding.    DRUG INTERACTIONS:  Drug interactions may change how your medications work or increase your risk for serious side effects. This document does not contain all possible drug interactions. Keep a list of all the products you use (including prescription/nonprescription drugs and herbal products)  and share it with your doctor and pharmacist. Do not start, stop, or change the dosage of any medicines without your doctor's approval. Check the labels on all your prescription and nonprescription/herbal products (such as antacids, laxatives, vitamins) because they may contain calcium, magnesium, phosphate, or vitamin D. Ask your pharmacist about using those products safely. This medication may interfere with certain laboratory tests (including cholesterol tests), possibly causing false test results. Make sure laboratory personnel and all your doctors know you use this drug.    OVERDOSE:  If overdose is suspected, contact a poison control center or emergency room immediately. [place] residents can call the [place] National Poison Hotline at 410-603-9930. [place] residents can call a Technical sales engineer center. Symptoms of overdose may include: seizures, confusion, irregular heartbeat.    NOTES:  Keep all regular medical and laboratory appointments. If your doctor has prescribed this medication, laboratory and/or medical tests (such as calcium/magnesium/phosphorus levels) should be performed periodically to monitor your progress or check for side effects. Consult your doctor for more details. Foods rich in vitamin D include: fortified dairy products, eggs, sardines, cod liver oil, chicken livers, and fatty fish.    MISSED DOSE:  If you miss a dose, take it as soon as you remember. If it is near the time of the next dose, skip the missed dose and resume your usual dosing schedule. Do not double the dose to catch up.    STORAGE:  Store vitamin D products (except alfacalcidol) at room temperature away  from light and moisture. Do not store in the bathroom. Store alfacalcidol drops in the refrigerator between 36-46 degrees F (2-8 degrees C). Do not freeze. Keep all medications away from children and pets. Do not flush medications down the toilet or pour them into a drain unless instructed to do so. Properly discard  this product when it is expired or no longer needed. Consult your pharmacist or local waste disposal company for more details about how to safely discard your product.    Information last revised December 2010 Copyright(c) 2010 [employer].

## 2010-12-05 NOTE — Assessment & Plan Note (Signed)
Check lab today con't otc vita d May need to f/u rheum

## 2010-12-05 NOTE — Progress Notes (Signed)
  Subjective:    Patient ID: Lynn Rodriguez, female    DOB: [birth year], 30 y.o.   MRN: [id number]  HPI  Pt here for f/u vita D deficiency.  Pt having muscle aches again.  Same as last summer.   She is only taking otc vita d now.  She felt better when on rx.    Review of Systems    as above Objective:   Physical Exam  Constitutional: She appears well-developed and well-nourished.  Psychiatric: She has a normal mood and affect. Judgment and thought content normal.          Assessment & Plan:

## 2010-12-06 ENCOUNTER — Other Ambulatory Visit: Payer: Self-pay | Admitting: Family Medicine

## 2010-12-16 ENCOUNTER — Telehealth: Payer: Self-pay | Admitting: *Deleted

## 2010-12-16 ENCOUNTER — Encounter: Payer: Self-pay | Admitting: *Deleted

## 2010-12-16 MED ORDER — VITAMIN D (ERGOCALCIFEROL) 1.25 MG (50000 UNIT) PO CAPS: 50000.0000 [IU] | ORAL_CAPSULE | ORAL | Status: DC

## 2010-12-16 NOTE — Telephone Encounter (Signed)
Discuss with patient   Normal---but we can still get it a little higher--- take vita D 50,000 u weekly #4 1 refill---for 2 months----take vita D 3 2000u daily with it----recheck 1 month

## 2011-01-09 ENCOUNTER — Ambulatory Visit (INDEPENDENT_AMBULATORY_CARE_PROVIDER_SITE_OTHER): Payer: BC Managed Care – PPO | Admitting: Family Medicine

## 2011-01-09 ENCOUNTER — Encounter: Payer: Self-pay | Admitting: Family Medicine

## 2011-01-09 DIAGNOSIS — R0789 Other chest pain: Secondary | ICD-10-CM

## 2011-01-09 DIAGNOSIS — J329 Chronic sinusitis, unspecified: Secondary | ICD-10-CM

## 2011-01-09 DIAGNOSIS — J40 Bronchitis, not specified as acute or chronic: Secondary | ICD-10-CM

## 2011-01-09 DIAGNOSIS — R05 Cough: Secondary | ICD-10-CM

## 2011-01-09 MED ORDER — PREDNISONE 10 MG PO TABS: 10.0000 mg | ORAL_TABLET | Freq: Every day | ORAL | Status: AC

## 2011-01-09 MED ORDER — ALBUTEROL SULFATE (2.5 MG/3ML) 0.083% IN NEBU
2.5000 mg | INHALATION_SOLUTION | Freq: Once | RESPIRATORY_TRACT | Status: AC
  Administered 2011-01-09: 2.5 mg via RESPIRATORY_TRACT

## 2011-01-09 MED ORDER — CLARITHROMYCIN ER 500 MG PO TB24: 1000.0000 mg | ORAL_TABLET | Freq: Every day | ORAL | Status: AC

## 2011-01-09 MED ORDER — AMOXICILLIN-POT CLAVULANATE 875-125 MG PO TABS: 1.0000 | ORAL_TABLET | Freq: Two times a day (BID) | ORAL | Status: DC

## 2011-01-09 NOTE — Progress Notes (Signed)
Addended by: Lelon Perla on: 01/09/2011 04:24 PM   Modules accepted: Orders

## 2011-01-09 NOTE — Patient Instructions (Signed)
Bronchitis Bronchitis is the body's way of reacting to injury and/or infection (inflammation) of the bronchi. Bronchi are the air tubes that extend from the windpipe into the lungs. If the inflammation becomes severe, it may cause shortness of breath.  CAUSES Inflammation may be caused by:  A virus.   Germs (bacteria).   Dust.   Allergens.   Pollutants and many other irritants.  The cells lining the bronchial tree are covered with tiny hairs (cilia). These constantly beat upward, away from the lungs, toward the mouth. This keeps the lungs free of pollutants. When these cells become too irritated and are unable to do their job, mucus begins to develop. This causes the characteristic cough of bronchitis. The cough clears the lungs when the cilia are unable to do their job. Without either of these protective mechanisms, the mucus would settle in the lungs. Then you would develop pneumonia. Smoking is a common cause of bronchitis and can contribute to pneumonia. Stopping this habit is the single most important thing you can do to help yourself. TREATMENT  Your caregiver may prescribe an antibiotic if the cough is caused by bacteria. Also, medicines that open up your airways make it easier to breathe. Your caregiver may also recommend or prescribe an expectorant. It will loosen the mucus to be coughed up. Only take over-the-counter or prescription medicines for pain, discomfort, or fever as directed by your caregiver.   Removing whatever causes the problem (smoking, for example) is critical to preventing the problem from getting worse.   Cough suppressants may be prescribed for relief of cough symptoms.   Inhaled medicines may be prescribed to help with symptoms now and to help prevent problems from returning.   For those with recurrent (chronic) bronchitis, there may be a need for steroid medicines.  SEEK IMMEDIATE MEDICAL CARE IF:  During treatment, you develop more pus-like mucus  (purulent sputum).   You or your child has an oral temperature above 100.4, not controlled by medicine.   Your baby is older than 3 months with a rectal temperature of 102 F (38.9 C) or higher.   Your baby is 63 months old or younger with a rectal temperature of 100.4 F (38 C) or higher.   You become progressively more ill.   You have increased difficulty breathing, wheezing, or shortness of breath.  It is necessary to seek immediate medical care if you are elderly or sick from any other disease. MAKE SURE YOU:  Understand these instructions.   Will watch your condition.   Will get help right away if you are not doing well or get worse.  Document Released: 06/16/2005 Document Re-Released: 09/10/2009  Sinusitis Sinuses are air pockets within the bones of your face. The growth of bacteria within a sinus leads to infection. Infection keeps the sinuses from draining. This infection is called sinusitis. SYMPTOMS There will be different areas of pain depending on which sinuses have become infected.  The maxillary sinuses often produce pain beneath the eyes.   Frontal sinusitis may cause pain in the middle of the forehead and above the eyes.  Other problems (symptoms) include:  Toothaches.   Colored, pus-like (purulent) drainage from the nose.   Any swelling, warmth, or tenderness over the sinus areas may be signs of infection.  TREATMENT Sinusitis is most often determined by an exam and you may have x-rays taken. If x-rays have been taken, make sure you obtain your results. Or find out how you are to obtain them. Your  caregiver may give you medications (antibiotics). These are medications that will help kill the infection. You may also be given a medication (decongestant) that helps to reduce sinus swelling.  HOME CARE INSTRUCTIONS  Only take over-the-counter or prescription medicines for pain, discomfort, or fever as directed by your caregiver.   Drink extra fluids. Fluids  help thin the mucus so your sinuses can drain more easily.   Applying either moist heat or ice packs to the sinus areas may help relieve discomfort.   Use saline nasal sprays to help moisten your sinuses. The sprays can be found at your local drugstore.  SEEK IMMEDIATE MEDICAL CARE IF YOU DEVELOP:  High fever that is still present after two days of antibiotic treatment.   Increasing pain, severe headaches, or toothache.   Nausea, vomiting, or drowsiness.   Unusual swelling around the face or trouble seeing.  MAKE SURE YOU:   Understand these instructions.   Will watch your condition.   Will get help right away if you are not doing well or get worse.  Document Released: 06/16/2005 Document Re-Released: 05/29/2008 [facility] Patient Information 2011 [place], [place].

## 2011-01-09 NOTE — Progress Notes (Signed)
  Subjective:     Lynn Rodriguez is a 30 y.o. female who presents for evaluation of symptoms of a URI. Symptoms include bilateral ear pressure/pain, congestion, nasal congestion, productive cough with  green colored sputum, sinus pressure and wheezing. Onset of symptoms was 7 days ago, and has been gradually worsening since that time. Treatment to date: none.  The following portions of the patient's history were reviewed and updated as appropriate: allergies, current medications, past family history, past medical history, past social history, past surgical history and problem list.  Review of Systems Pertinent items are noted in HPI.   Objective:    BP 124/80  Pulse 67  Temp(Src) 98.8 F (37.1 C) (Oral)  Wt 247 lb (112.038 kg)  SpO2 97% General appearance: alert, cooperative, appears stated age and no distress Head: Normocephalic, without obvious abnormality, atraumatic Eyes: conjunctivae/corneas clear. PERRL, EOM's intact. Fundi benign. Ears: normal TM's and external ear canals both ears Nose: Nares normal. Septum midline. Mucosa normal. No drainage or sinus tenderness., turbinates red, swollen, sinus tenderness bilateral Throat: lips, mucosa, and tongue normal; teeth and gums normal Neck: no adenopathy, no carotid bruit, no JVD, supple, symmetrical, trachea midline and thyroid not enlarged, symmetric, no tenderness/mass/nodules Lungs: wheezes bilaterally Heart: regular rate and rhythm, S1, S2 normal, no murmur, click, rub or gallop   Assessment:    bronchitis and sinusitis   Plan:    Discussed the diagnosis and treatment of sinusitis. Suggested symptomatic OTC remedies. Augmentin per orders. Follow up as needed.

## 2011-01-10 ENCOUNTER — Telehealth: Payer: Self-pay | Admitting: *Deleted

## 2011-01-10 MED ORDER — BENZONATATE 100 MG PO CAPS: 200.0000 mg | ORAL_CAPSULE | Freq: Three times a day (TID) | ORAL | Status: AC | PRN

## 2011-01-10 NOTE — Telephone Encounter (Signed)
Pt left message noting that she has a terrible cough and it is making her chest hurt. Pt is requesting cough syrup be sent to her pharmacy. Please advise.

## 2011-01-10 NOTE — Telephone Encounter (Signed)
Seen 01/09/12 with chest tightness and  Cough. Please advise    KP

## 2011-01-10 NOTE — Telephone Encounter (Signed)
Rx sent to the pharmacy and VM left making patient aware     KP

## 2011-01-10 NOTE — Telephone Encounter (Signed)
Can she have cough med with codeine---- see allergy--- if yes cheratussin 1-2 tsp qhs   6 oz If no ---tessalon perles 200 mg 1 po tid prn #20

## 2011-01-15 ENCOUNTER — Telehealth: Payer: Self-pay | Admitting: *Deleted

## 2011-01-15 NOTE — Telephone Encounter (Signed)
Pt c/o itching all over pain/pressure upper abdomin when taking prednisone.

## 2011-01-15 NOTE — Telephone Encounter (Signed)
If she is down to 1 a day she can stop it

## 2011-01-15 NOTE — Telephone Encounter (Signed)
Pt states that she is currently at 2 tab a day but will start 1 tab a day on tomorrow. Per Dr Laury Axon advise Pt ok to go ahead and stop med. Pt ok

## 2011-01-15 NOTE — Telephone Encounter (Signed)
See note below

## 2011-01-23 ENCOUNTER — Ambulatory Visit (HOSPITAL_COMMUNITY)
Admission: RE | Admit: 2011-01-23 | Discharge: 2011-01-23 | Disposition: A | Discharge status: Home / Self Care | Payer: BC Managed Care – PPO | Source: Ambulatory Visit | Attending: Family Medicine | Admitting: Family Medicine

## 2011-01-23 ENCOUNTER — Telehealth: Payer: Self-pay | Admitting: Family Medicine

## 2011-01-23 DIAGNOSIS — R0789 Other chest pain: Secondary | ICD-10-CM

## 2011-01-23 DIAGNOSIS — J45909 Unspecified asthma, uncomplicated: Secondary | ICD-10-CM | POA: Insufficient documentation

## 2011-01-23 NOTE — Telephone Encounter (Signed)
Spoke w/ pt says that chest tightness is still present says that antibiotics didn't really help much feels it more when taking deep breath. Wants to know what can be done next.

## 2011-01-23 NOTE — Telephone Encounter (Signed)
Check cxr Does she still have congestion?---we can change abx

## 2011-01-23 NOTE — Telephone Encounter (Signed)
Pt not having any congestion but will go for chest xray. Says she believes it may be her asthma since she was working out in a hot building recently.

## 2011-01-31 ENCOUNTER — Telehealth: Payer: Self-pay | Admitting: Family Medicine

## 2011-02-01 ENCOUNTER — Emergency Department (HOSPITAL_BASED_OUTPATIENT_CLINIC_OR_DEPARTMENT_OTHER)
Admission: EM | Admit: 2011-02-01 | Discharge: 2011-02-01 | Disposition: A | Discharge status: Home / Self Care | Payer: BC Managed Care – PPO | Attending: Emergency Medicine | Admitting: Emergency Medicine

## 2011-02-01 ENCOUNTER — Other Ambulatory Visit: Payer: Self-pay | Admitting: Family Medicine

## 2011-02-01 ENCOUNTER — Encounter (HOSPITAL_BASED_OUTPATIENT_CLINIC_OR_DEPARTMENT_OTHER): Payer: Self-pay | Admitting: Emergency Medicine

## 2011-02-01 DIAGNOSIS — K219 Gastro-esophageal reflux disease without esophagitis: Secondary | ICD-10-CM | POA: Insufficient documentation

## 2011-02-01 DIAGNOSIS — R1013 Epigastric pain: Secondary | ICD-10-CM | POA: Insufficient documentation

## 2011-02-01 LAB — URINALYSIS, ROUTINE W REFLEX MICROSCOPIC
Bilirubin Urine: NEGATIVE
Glucose, UA: NEGATIVE mg/dL
Ketones, ur: NEGATIVE mg/dL
Protein, ur: NEGATIVE mg/dL
Urobilinogen, UA: 0.2 mg/dL (ref 0.0–1.0)

## 2011-02-01 LAB — COMPREHENSIVE METABOLIC PANEL
Albumin: 3.7 g/dL (ref 3.5–5.2)
Alkaline Phosphatase: 68 U/L (ref 39–117)
BUN: 12 mg/dL (ref 6–23)
Creatinine, Ser: 0.8 mg/dL (ref 0.50–1.10)
GFR calc Af Amer: >60 mL/min (ref >60)
Glucose, Bld: 81 mg/dL (ref 70–99)
Potassium: 3.6 mEq/L (ref 3.5–5.1)
Total Bilirubin: 0.2 mg/dL — ABNORMAL LOW (ref 0.3–1.2)
Total Protein: 7.7 g/dL (ref 6.0–8.3)

## 2011-02-01 LAB — CBC
HCT: 37.6 % (ref 36.0–46.0)
Hemoglobin: 12.9 g/dL (ref 12.0–15.0)
MCHC: 34.3 g/dL (ref 30.0–36.0)
RDW: 12.6 % (ref 11.5–15.5)
WBC: 5.3 10*3/uL (ref 4.0–10.5)

## 2011-02-01 LAB — DIFFERENTIAL
Basophils Relative: 0 % (ref 0–1)
Eosinophils Absolute: 0.2 10*3/uL (ref 0.0–0.7)
Eosinophils Relative: 3 % (ref 0–5)
Lymphs Abs: 2.2 10*3/uL (ref 0.7–4.0)
Monocytes Absolute: 0.5 10*3/uL (ref 0.1–1.0)
Monocytes Relative: 10 % (ref 3–12)

## 2011-02-01 LAB — PREGNANCY, URINE: Preg Test, Ur: NEGATIVE

## 2011-02-01 LAB — URINE MICROSCOPIC-ADD ON

## 2011-02-01 LAB — LIPASE, BLOOD: Lipase: 31 U/L (ref 11–59)

## 2011-02-01 NOTE — ED Provider Notes (Signed)
History     CSN: [id number] Arrival date & time: 02/01/2011  7:46 PM  Chief Complaint  Patient presents with  . Abdominal Pain   HPI Comments: Pt states that she was treated with 2 separate antibiotics for bronchitis recently  Patient is a 30 y.o. female presenting with abdominal pain. The history is provided by the patient. No language interpreter was used.  Abdominal Pain The primary symptoms of the illness include abdominal pain. The primary symptoms of the illness do not include fever, nausea, vomiting or diarrhea. The current episode started more than 2 days ago. The onset of the illness was gradual. The problem has not changed since onset. The illness is associated with recent antibiotic use. The patient states that she believes she is currently not pregnant. The patient has not had a change in bowel habit. Symptoms associated with the illness do not include chills, anorexia, diaphoresis, urgency, hematuria or frequency.    Past Medical History  Diagnosis Date  . Allergy   . GERD (gastroesophageal reflux disease)     Past Surgical History  Procedure Date  . Tonsillectomy   . Cholecystectomy     Family History  Problem Relation Age of Onset  . Heart disease    . Diabetes    . Hyperlipidemia    . Ovarian cancer    . Depression    . Anemia      History  Substance Use Topics  . Smoking status: Never Smoker   . Smokeless tobacco: Not on file  . Alcohol Use: Yes    OB History    Grav Para Term Preterm Abortions TAB SAB Ect Mult Living                  Review of Systems  Constitutional: Negative for fever, chills and diaphoresis.  Gastrointestinal: Positive for abdominal pain. Negative for nausea, vomiting, diarrhea and anorexia.  Genitourinary: Negative for urgency, frequency and hematuria.  All other systems reviewed and are negative.    Physical Exam  BP 131/82  Pulse 70  Temp(Src) 98 F (36.7 C) (Oral)  Resp 16  SpO2 100%  LMP 01/27/2011  Physical  Exam  Nursing note and vitals reviewed. Constitutional: She is oriented to person, place, and time. She appears well-developed and well-nourished.  HENT:  Head: Normocephalic and atraumatic.  Eyes: Pupils are equal, round, and reactive to light.  Cardiovascular: Normal rate and regular rhythm.   Pulmonary/Chest: Effort normal and breath sounds normal.  Abdominal: Soft. There is tenderness in the epigastric area.  Neurological: She is alert and oriented to person, place, and time.  Skin: Skin is warm and dry.    ED Course  Procedures  Results for orders placed during the hospital encounter of 02/01/11  COMPREHENSIVE METABOLIC PANEL      Component Value Range   Sodium 140  135 - 145 (mEq/L)   Potassium 3.6  3.5 - 5.1 (mEq/L)   Chloride 102  96 - 112 (mEq/L)   CO2 26  19 - 32 (mEq/L)   Glucose, Bld 81  70 - 99 (mg/dL)   BUN 12  6 - 23 (mg/dL)   Creatinine, Ser 4.09  0.50 - 1.10 (mg/dL)   Calcium 9.6  8.4 - 81.1 (mg/dL)   Total Protein 7.7  6.0 - 8.3 (g/dL)   Albumin 3.7  3.5 - 5.2 (g/dL)   AST 21  0 - 37 (U/L)   ALT 15  0 - 35 (U/L)   Alkaline Phosphatase 68  39 - 117 (U/L)   Total Bilirubin 0.2 (*) 0.3 - 1.2 (mg/dL)   GFR calc non Af Amer >60  >60 (mL/min)   GFR calc Af Amer >60  >60 (mL/min)  LIPASE, BLOOD      Component Value Range   Lipase 31  11 - 59 (U/L)  CBC      Component Value Range   WBC 5.3  4.0 - 10.5 (K/uL)   RBC 4.11  3.87 - 5.11 (MIL/uL)   Hemoglobin 12.9  12.0 - 15.0 (g/dL)   HCT 96.0  45.4 - 09.8 (%)   MCV 91.5  78.0 - 100.0 (fL)   MCH 31.4  26.0 - 34.0 (pg)   MCHC 34.3  30.0 - 36.0 (g/dL)   RDW 11.9  14.7 - 82.9 (%)   Platelets 225  150 - 400 (K/uL)  DIFFERENTIAL      Component Value Range   Neutrophils Relative 46  43 - 77 (%)   Neutro Abs 2.5  1.7 - 7.7 (K/uL)   Lymphocytes Relative 41  12 - 46 (%)   Lymphs Abs 2.2  0.7 - 4.0 (K/uL)   Monocytes Relative 10  3 - 12 (%)   Monocytes Absolute 0.5  0.1 - 1.0 (K/uL)   Eosinophils Relative 3  0 - 5  (%)   Eosinophils Absolute 0.2  0.0 - 0.7 (K/uL)   Basophils Relative 0  0 - 1 (%)   Basophils Absolute 0.0  0.0 - 0.1 (K/uL)  URINALYSIS, ROUTINE W REFLEX MICROSCOPIC      Component Value Range   Color, Urine YELLOW  YELLOW    Appearance CLEAR  CLEAR    Specific Gravity, Urine 1.015  1.005 - 1.030    pH 6.0  5.0 - 8.0    Glucose, UA NEGATIVE  NEGATIVE (mg/dL)   Hgb urine dipstick LARGE (*) NEGATIVE    Bilirubin Urine NEGATIVE  NEGATIVE    Ketones, ur NEGATIVE  NEGATIVE (mg/dL)   Protein, ur NEGATIVE  NEGATIVE (mg/dL)   Urobilinogen, UA 0.2  0.0 - 1.0 (mg/dL)   Nitrite NEGATIVE  NEGATIVE    Leukocytes, UA NEGATIVE  NEGATIVE   PREGNANCY, URINE      Component Value Range   Preg Test, Ur NEGATIVE    URINE MICROSCOPIC-ADD ON      Component Value Range   Squamous Epithelial / LPF RARE  RARE    WBC, UA 0-2  <3 (WBC/hpf)   RBC / HPF 0-2  <3 (RBC/hpf)   Bacteria, UA RARE  RARE      MDM Labs completely normal:no imagining needed at this time:pt okay to go home and follow up with Dr. Loreta Ave with [facility]      Teressa Lower, NP 02/01/11 2152

## 2011-02-01 NOTE — ED Notes (Signed)
Pt c/o BUQ pain x 1 wk; becoming more constant; some nausea (hx chole)

## 2011-02-01 NOTE — ED Provider Notes (Signed)
Medical screening examination/treatment/procedure(s) were performed by non-physician practitioner and as supervising physician I was immediately available for consultation/collaboration.   Denean Pavon W Blakelynn Scheeler, MD 02/01/11 2328 

## 2011-02-03 NOTE — Telephone Encounter (Signed)
August 16th at 315

## 2011-02-04 NOTE — Telephone Encounter (Signed)
Added appt for 8/16 at 3:15 yesterday on 02/03/2011

## 2011-02-06 ENCOUNTER — Other Ambulatory Visit: Payer: Self-pay | Admitting: Family Medicine

## 2011-02-06 MED ORDER — LEVOCETIRIZINE DIHYDROCHLORIDE 5 MG PO TABS: 5.0000 mg | ORAL_TABLET | Freq: Every evening | ORAL | Status: DC

## 2011-02-06 NOTE — Telephone Encounter (Signed)
Rx sent to pharmacy   

## 2011-02-13 ENCOUNTER — Ambulatory Visit (INDEPENDENT_AMBULATORY_CARE_PROVIDER_SITE_OTHER): Payer: BC Managed Care – PPO | Admitting: Family Medicine

## 2011-02-13 ENCOUNTER — Encounter: Payer: Self-pay | Admitting: Family Medicine

## 2011-02-13 VITALS — BP 140/78 | HR 71 | Temp 98.8°F | Resp 20 | Ht 70.0 in | Wt 245.0 lb

## 2011-02-13 DIAGNOSIS — J302 Other seasonal allergic rhinitis: Secondary | ICD-10-CM

## 2011-02-13 DIAGNOSIS — J309 Allergic rhinitis, unspecified: Secondary | ICD-10-CM

## 2011-02-13 DIAGNOSIS — Z0000 Encounter for general adult medical examination without abnormal findings: Secondary | ICD-10-CM

## 2011-02-13 MED ORDER — CETIRIZINE HCL 10 MG PO CHEW: 10.0000 mg | CHEWABLE_TABLET | Freq: Every day | ORAL | Status: DC

## 2011-02-13 NOTE — Progress Notes (Signed)
  Subjective:     Lynn Rodriguez is a 30 y.o. female and is here for a comprehensive physical exam. The patient reports no problems.  History   Social History  . Marital Status: Single    Spouse Name: N/A    Number of Children: N/A  . Years of Education: N/A   Occupational History  . Not on file.   Social History Main Topics  . Smoking status: Never Smoker   . Smokeless tobacco: Not on file  . Alcohol Use: Yes  . Drug Use: No  . Sexually Active: Not Currently -- Female partner(s)   Other Topics Concern  . Not on file   Social History Narrative  . No narrative on file   Health Maintenance  Topic Date Due  . Pap Smear  05/15/2013  . Tetanus/tdap  01/03/2020    The following portions of the patient's history were reviewed and updated as appropriate: allergies, current medications, past family history, past medical history, past social history, past surgical history and problem list.  Review of Systems Review of Systems  Constitutional: Negative for activity change, appetite change and fatigue.  HENT: Negative for hearing loss, congestion, tinnitus and ear discharge.  dentist q44m Eyes: Negative for visual disturbance (see optho q1y -- vision corrected to 20/20 with glasses).  Respiratory: Negative for cough, chest tightness and shortness of breath.   Cardiovascular: Negative for chest pain, palpitations and leg swelling.  Gastrointestinal: Negative for abdominal pain, diarrhea, constipation and abdominal distention.  Genitourinary: Negative for urgency, frequency, decreased urine volume and difficulty urinating.  Musculoskeletal: Negative for back pain, arthralgias and gait problem.  Skin: Negative for color change, pallor and rash.  Neurological: Negative for dizziness, light-headedness, numbness and headaches.  Hematological: Negative for adenopathy. Does not bruise/bleed easily.  Psychiatric/Behavioral: Negative for suicidal ideas, confusion, sleep disturbance, self-injury,  dysphoric mood, decreased concentration and agitation.       Objective:    BP 140/78  Pulse 71  Temp(Src) 98.8 F (37.1 C) (Oral)  Resp 20  Ht 5\' 10"  (1.778 m)  Wt 245 lb (111.131 kg)  BMI 35.15 kg/m2  SpO2 98%  LMP 01/27/2011 General appearance: alert, cooperative, appears stated age and no distress Head: Normocephalic, without obvious abnormality, atraumatic Eyes: conjunctivae/corneas clear. PERRL, EOM's intact. Fundi benign. Ears: normal TM's and external ear canals both ears Nose: Nares normal. Septum midline. Mucosa normal. No drainage or sinus tenderness. Throat: lips, mucosa, and tongue normal; teeth and gums normal Neck: no adenopathy, no carotid bruit, no JVD, supple, symmetrical, trachea midline and thyroid not enlarged, symmetric, no tenderness/mass/nodules Back: symmetric, no curvature. ROM normal. No CVA tenderness. Lungs: clear to auscultation bilaterally Breasts: gyn Heart: regular rate and rhythm, S1, S2 normal, no murmur, click, rub or gallop Abdomen: soft, non-tender; bowel sounds normal; no masses,  no organomegaly Pelvic: gyn Extremities: extremities normal, atraumatic, no cyanosis or edema Pulses: 2+ and symmetric Skin: Skin color, texture, turgor normal. No rashes or lesions Lymph nodes: Cervical, supraclavicular, and axillary nodes normal. Neurologic: Alert and oriented X 3, normal strength and tone. Normal symmetric reflexes. Normal coordination and gait    Assessment:    Healthy female exam. Seasonal allergies---zyrtec prn Plan:    ghm utd Check fasting labs  See After Visit Summary for Counseling Recommendations

## 2011-02-13 NOTE — Patient Instructions (Signed)

## 2011-02-18 ENCOUNTER — Telehealth: Payer: Self-pay | Admitting: *Deleted

## 2011-02-18 ENCOUNTER — Other Ambulatory Visit: Payer: Self-pay | Admitting: Family Medicine

## 2011-02-18 DIAGNOSIS — Z0000 Encounter for general adult medical examination without abnormal findings: Secondary | ICD-10-CM

## 2011-02-18 NOTE — Telephone Encounter (Signed)
Prior auth approved from 01-20-11 until 02-10-12. Approval letter scan to chart, pharmacy notified via fax.

## 2011-02-19 ENCOUNTER — Other Ambulatory Visit (INDEPENDENT_AMBULATORY_CARE_PROVIDER_SITE_OTHER): Payer: BC Managed Care – PPO

## 2011-02-19 DIAGNOSIS — E559 Vitamin D deficiency, unspecified: Secondary | ICD-10-CM

## 2011-02-19 DIAGNOSIS — Z0000 Encounter for general adult medical examination without abnormal findings: Secondary | ICD-10-CM

## 2011-02-19 LAB — CBC WITH DIFFERENTIAL/PLATELET
Basophils Relative: 0.6 % (ref 0.0–3.0)
Eosinophils Relative: 4 % (ref 0.0–5.0)
HCT: 38.8 % (ref 36.0–46.0)
Monocytes Relative: 12 % (ref 3.0–12.0)
Neutrophils Relative %: 50.2 % (ref 43.0–77.0)
Platelets: 222 10*3/uL (ref 150.0–400.0)
RBC: 4.07 Mil/uL (ref 3.87–5.11)
WBC: 4.3 10*3/uL — ABNORMAL LOW (ref 4.5–10.5)

## 2011-02-19 LAB — POCT URINALYSIS DIPSTICK
Leukocytes, UA: NEGATIVE
Nitrite, UA: NEGATIVE
Protein, UA: NEGATIVE
Urobilinogen, UA: 0.2

## 2011-02-19 LAB — LIPID PANEL
LDL Cholesterol: 91 mg/dL (ref 0–99)
Total CHOL/HDL Ratio: 3

## 2011-02-19 LAB — BASIC METABOLIC PANEL
BUN: 8 mg/dL (ref 6–23)
Chloride: 107 mEq/L (ref 96–112)
Creatinine, Ser: 0.8 mg/dL (ref 0.4–1.2)
GFR: 116.32 mL/min (ref >60.00)
Potassium: 4.1 mEq/L (ref 3.5–5.1)

## 2011-02-19 LAB — HEPATIC FUNCTION PANEL
ALT: 15 U/L (ref 0–35)
AST: 17 U/L (ref 0–37)
Bilirubin, Direct: 0 mg/dL (ref 0.0–0.3)
Total Bilirubin: 0.6 mg/dL (ref 0.3–1.2)
Total Protein: 7.3 g/dL (ref 6.0–8.3)

## 2011-02-19 LAB — TSH: TSH: 0.79 u[IU]/mL (ref 0.35–5.50)

## 2011-02-19 NOTE — Progress Notes (Signed)
Labs only

## 2011-02-19 NOTE — Progress Notes (Signed)
12  

## 2011-02-20 LAB — VITAMIN D 25 HYDROXY (VIT D DEFICIENCY, FRACTURES): Vit D, 25-Hydroxy: 57 ng/mL (ref 30–89)

## 2011-03-05 ENCOUNTER — Telehealth: Payer: Self-pay | Admitting: *Deleted

## 2011-03-05 NOTE — Telephone Encounter (Signed)
Pt left VM that she was recently seen for anxiety/stress. Pt notes that upon returning to work she has experience increased levels of stress and would like to have a note to take her out of work for a while. Left message to call office to get more information on the matter.

## 2011-03-06 NOTE — Telephone Encounter (Signed)
Left message to call office

## 2011-03-12 NOTE — Telephone Encounter (Signed)
Call from patient and she stated she was returning the call but she was calling for her mother but it was already handled     KP

## 2011-04-08 LAB — CBC
HCT: 41.5
Hemoglobin: 13.8
MCHC: 33.2
MCV: 91.6
Platelets: 268
RBC: 4.53
RDW: 13.5
WBC: 6.7

## 2011-04-08 LAB — COMPREHENSIVE METABOLIC PANEL WITH GFR
ALT: 13
AST: 18
CO2: 25
Calcium: 9.4
Chloride: 103
GFR calc Af Amer: >60
GFR calc non Af Amer: >60
Glucose, Bld: 77
Sodium: 138
Total Bilirubin: 0.3

## 2011-04-08 LAB — DIFFERENTIAL
Basophils Absolute: 0
Basophils Relative: 0
Eosinophils Absolute: 0.1
Eosinophils Relative: 2
Lymphocytes Relative: 36
Lymphs Abs: 2.4
Monocytes Absolute: 0.4
Monocytes Relative: 7
Neutro Abs: 3.7
Neutrophils Relative %: 56

## 2011-04-08 LAB — URINALYSIS, ROUTINE W REFLEX MICROSCOPIC
Bilirubin Urine: NEGATIVE
Glucose, UA: NEGATIVE
Hgb urine dipstick: NEGATIVE
Ketones, ur: NEGATIVE
Nitrite: NEGATIVE
Protein, ur: NEGATIVE
Specific Gravity, Urine: 1.015
Urobilinogen, UA: 0.2
pH: 6.5

## 2011-04-08 LAB — COMPREHENSIVE METABOLIC PANEL
Albumin: 3.8
Alkaline Phosphatase: 43
BUN: 6
Creatinine, Ser: 0.72
Potassium: 3.8
Total Protein: 7.4

## 2011-04-08 LAB — LIPASE, BLOOD: Lipase: 23

## 2011-04-09 LAB — DIFFERENTIAL
Basophils Relative: 1
Eosinophils Absolute: 0
Monocytes Absolute: 0.4
Monocytes Relative: 7

## 2011-04-09 LAB — CBC
HCT: 39.7
Hemoglobin: 13.2
MCHC: 33.2
MCV: 91.4
RBC: 4.35

## 2011-04-09 LAB — I-STAT 8, (EC8 V) (CONVERTED LAB)
Acid-Base Excess: 1
Bicarbonate: 26.1 — ABNORMAL HIGH
HCT: 44
Hemoglobin: 15
Operator id: 234501
Sodium: 136
TCO2: 27
pCO2, Ven: 44 — ABNORMAL LOW

## 2012-08-24 ENCOUNTER — Telehealth: Payer: Self-pay | Admitting: Obstetrics and Gynecology

## 2012-08-25 ENCOUNTER — Ambulatory Visit: Payer: BC Managed Care – PPO | Admitting: Family Medicine

## 2012-08-25 ENCOUNTER — Encounter: Payer: Self-pay | Admitting: Family Medicine

## 2012-08-25 VITALS — BP 120/80 | Ht 69.0 in | Wt 265.0 lb

## 2012-08-25 DIAGNOSIS — R102 Pelvic and perineal pain unspecified side: Secondary | ICD-10-CM

## 2012-08-25 LAB — POCT URINALYSIS DIPSTICK
Bilirubin, UA: NEGATIVE
Blood, UA: NEGATIVE
Glucose, UA: NEGATIVE
Ketones, UA: NEGATIVE
Leukocytes, UA: NEGATIVE
Nitrite, UA: NEGATIVE
Protein, UA: NEGATIVE
Spec Grav, UA: 1.01
Urobilinogen, UA: NEGATIVE
pH, UA: 5

## 2012-08-25 NOTE — Progress Notes (Signed)
Subjective: Patient is a 32 y.o. gravida 0 para 0, female.  Presents for evaluation of chronic pelvic pain. Onset of symptoms was abrupt starting 2 days ago with stable course since that time.  It is located in the RLQ and radiates down right leg and lasts a few hours. She describes the pain as dull. Symptoms improve with NSAIDs and heating pad. In the past, she has no treatment for this similar problem only told it was cyst.  She has no history of PID, STD's, still a virgin. She does desire further childbearing.  Pertinent Gyn History: Menses: flow is light Bleeding: last 5-7 days, no break through bleeding Contraception: abstinence DES exposure: denies Blood transfusions: none STDs: no past history Preventive screening:  Last mammogram: never Date: n/a Last pap: normal Date: 07/29/2011  Patient Active Problem List   Diagnosis Date Noted  . Vitamin D deficiency 12/05/2010  . NAUSEA AND VOMITING 03/08/2010  . MYALGIA 01/30/2010  . CONTACT DERMATITIS&OTHER ECZEMA DUE UNSPEC CAUSE 01/09/2010  . CANDIDIASIS OF VULVA AND VAGINA 12/21/2009  . URI 12/21/2009  . PLANTAR FASCIITIS 10/25/2009  . MUSCLE SPASM, TRAPEZIUS 10/25/2009  . MUSCLE CRAMPS, FOOT 10/25/2009  . ASTHMA NOS W/ACUTE EXACERBATION 08/29/2009  . STREPTOCOCCAL SORE THROAT 07/24/2008  . ABDOMINAL PAIN RIGHT LOWER QUADRANT 05/08/2008  . ALLERGIC ASTHMA 08/19/2007  . COUGH 08/19/2007  . GERD 08/06/2007  . PELVIC  PAIN 04/28/2007  . DERMATOPHYTOSIS, FOOT 12/14/2006  . Acute Sinusitis, Unspecified 12/14/2006  . ALLERGIC RHINITIS 12/14/2006   Past Medical History  Diagnosis Date  . Allergy   . GERD (gastroesophageal reflux disease)   . ADHD (attention deficit hyperactivity disorder) 03/2012    Past Surgical History  Procedure Laterality Date  . Tonsillectomy    . Cholecystectomy       (Not in a hospital admission) Allergies  Allergen Reactions  . Penicillins Nausea Only  . Acetaminophen Other (See Comments)   Lowers or raises heart rate  . Prednisone Nausea Only    History  Substance Use Topics  . Smoking status: Never Smoker   . Smokeless tobacco: Not on file  . Alcohol Use: Yes    Family History  Problem Relation Age of Onset  . Heart disease    . Diabetes    . Hyperlipidemia    . Ovarian cancer    . Depression    . Anemia      Review of Systems A comprehensive review of systems was negative except for: Gastrointestinal: positive for abdominal pain, change in bowel habits and constipation     Objective: Vital signs in last 24 hours: Lungs: chest clear, no wheezing, rales, normal symmetric air entry, Heart exam - S1, S2 normal, no murmur, no gallop, rate regular Heart: S1, S2 normal, no murmur, rub or gallop, regular rate and rhythm GU: abdomen soft, non tender, no masses, but moderate pain to RLQ with voluntary guarding.   Pelvic: normal external genitalia, vulva, vagina, exam chaperoned by female assistant, EGBUS within normal limits, nulliparous os, uterus normal size, shape, consistency, no mass or tenderness, right adnexal tenderness , but no palpable mass.       Data Review:  Reviewed paper chart and patient has history significant for ovarian cyst, right hip bone spur.  Assessment/Plan: 1. Pelvic Pain: Ordered Transvaginal ultrasound, patient declined GC/CT cultures due to abstinence. Encouraged patient to f/u with scheduled GI appoint. 2. Patient declines need stronger pain medication, thus will use Motrin 600 mg q 6 PRN for pain.  L.Lennette Fader,  FNP-BC

## 2012-08-26 ENCOUNTER — Encounter (HOSPITAL_COMMUNITY): Payer: Self-pay

## 2012-08-26 ENCOUNTER — Ambulatory Visit (HOSPITAL_COMMUNITY)
Admission: RE | Admit: 2012-08-26 | Discharge: 2012-08-26 | Disposition: A | Discharge status: Home / Self Care | Payer: BC Managed Care – PPO | Source: Ambulatory Visit | Attending: Family Medicine | Admitting: Family Medicine

## 2012-08-26 ENCOUNTER — Other Ambulatory Visit (HOSPITAL_COMMUNITY): Payer: Self-pay | Admitting: Family Medicine

## 2012-08-26 DIAGNOSIS — R109 Unspecified abdominal pain: Secondary | ICD-10-CM | POA: Insufficient documentation

## 2012-08-26 DIAGNOSIS — R1031 Right lower quadrant pain: Secondary | ICD-10-CM

## 2012-08-26 DIAGNOSIS — N949 Unspecified condition associated with female genital organs and menstrual cycle: Secondary | ICD-10-CM | POA: Insufficient documentation

## 2012-08-26 HISTORY — DX: Unspecified asthma, uncomplicated: J45.909

## 2012-08-26 MED ORDER — IOHEXOL 300 MG/ML  SOLN
25.0000 mL | Freq: Once | INTRAMUSCULAR | Status: AC | PRN
  Administered 2012-08-26: 25 mL via ORAL

## 2012-08-26 MED ORDER — IOHEXOL 300 MG/ML  SOLN
100.0000 mL | Freq: Once | INTRAMUSCULAR | Status: AC | PRN
  Administered 2012-08-26: 100 mL via INTRAVENOUS

## 2012-08-30 ENCOUNTER — Telehealth: Payer: Self-pay | Admitting: Obstetrics and Gynecology

## 2012-08-30 NOTE — Telephone Encounter (Signed)
Lynn Rodriguez, please see pt message, do you need anything with this?

## 2012-10-13 IMAGING — CR DG CHEST 2V
2 series · 2 of 2 positions shown · non-contrast
Comparison: 08/19/2007

CLINICAL DATA: Chest tightness with pain in the upper chest and
under both breasts.  Recent bronchitis with history of asthma.
Nonsmoker

CHEST - 2 VIEW

[w chest pa *]
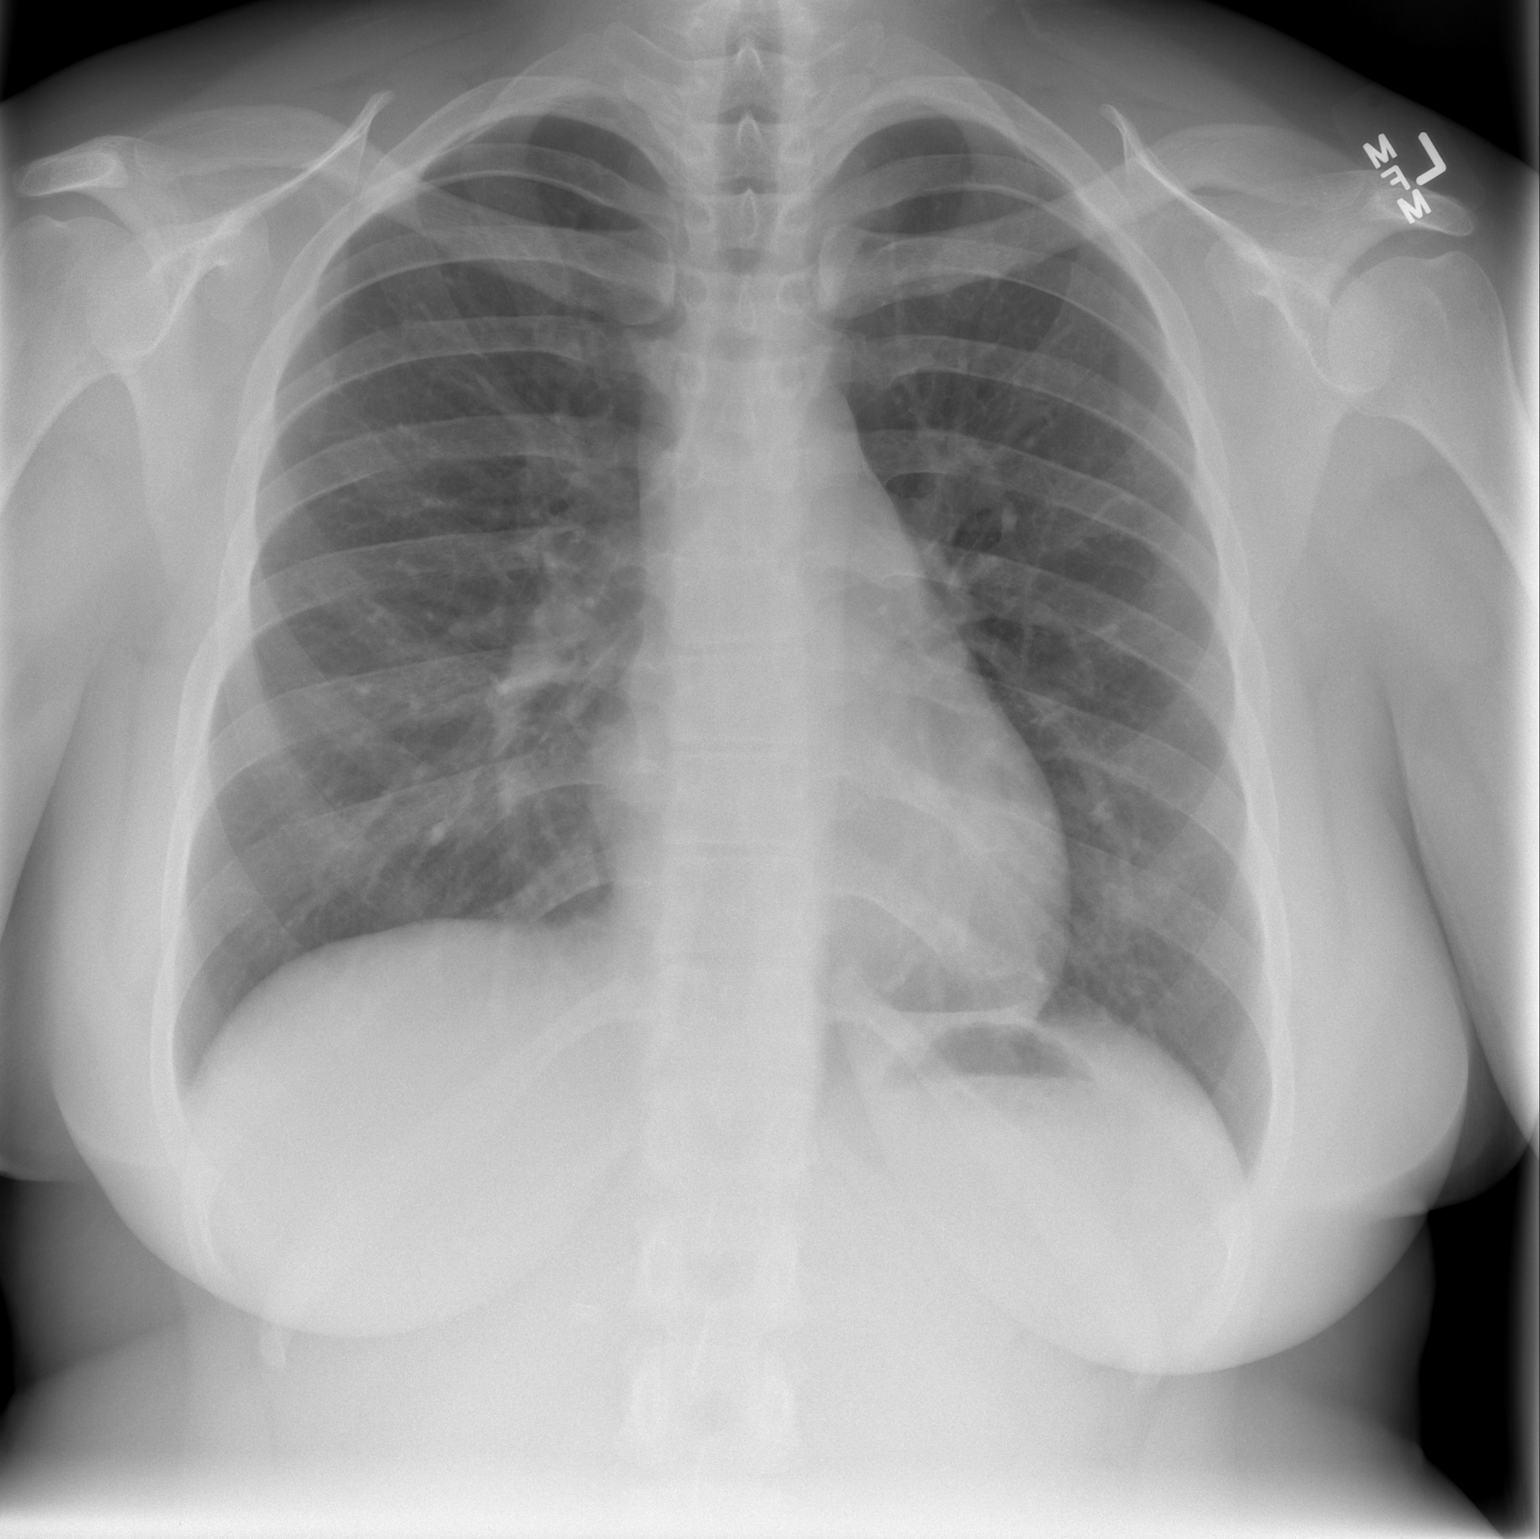

[w chest lat]
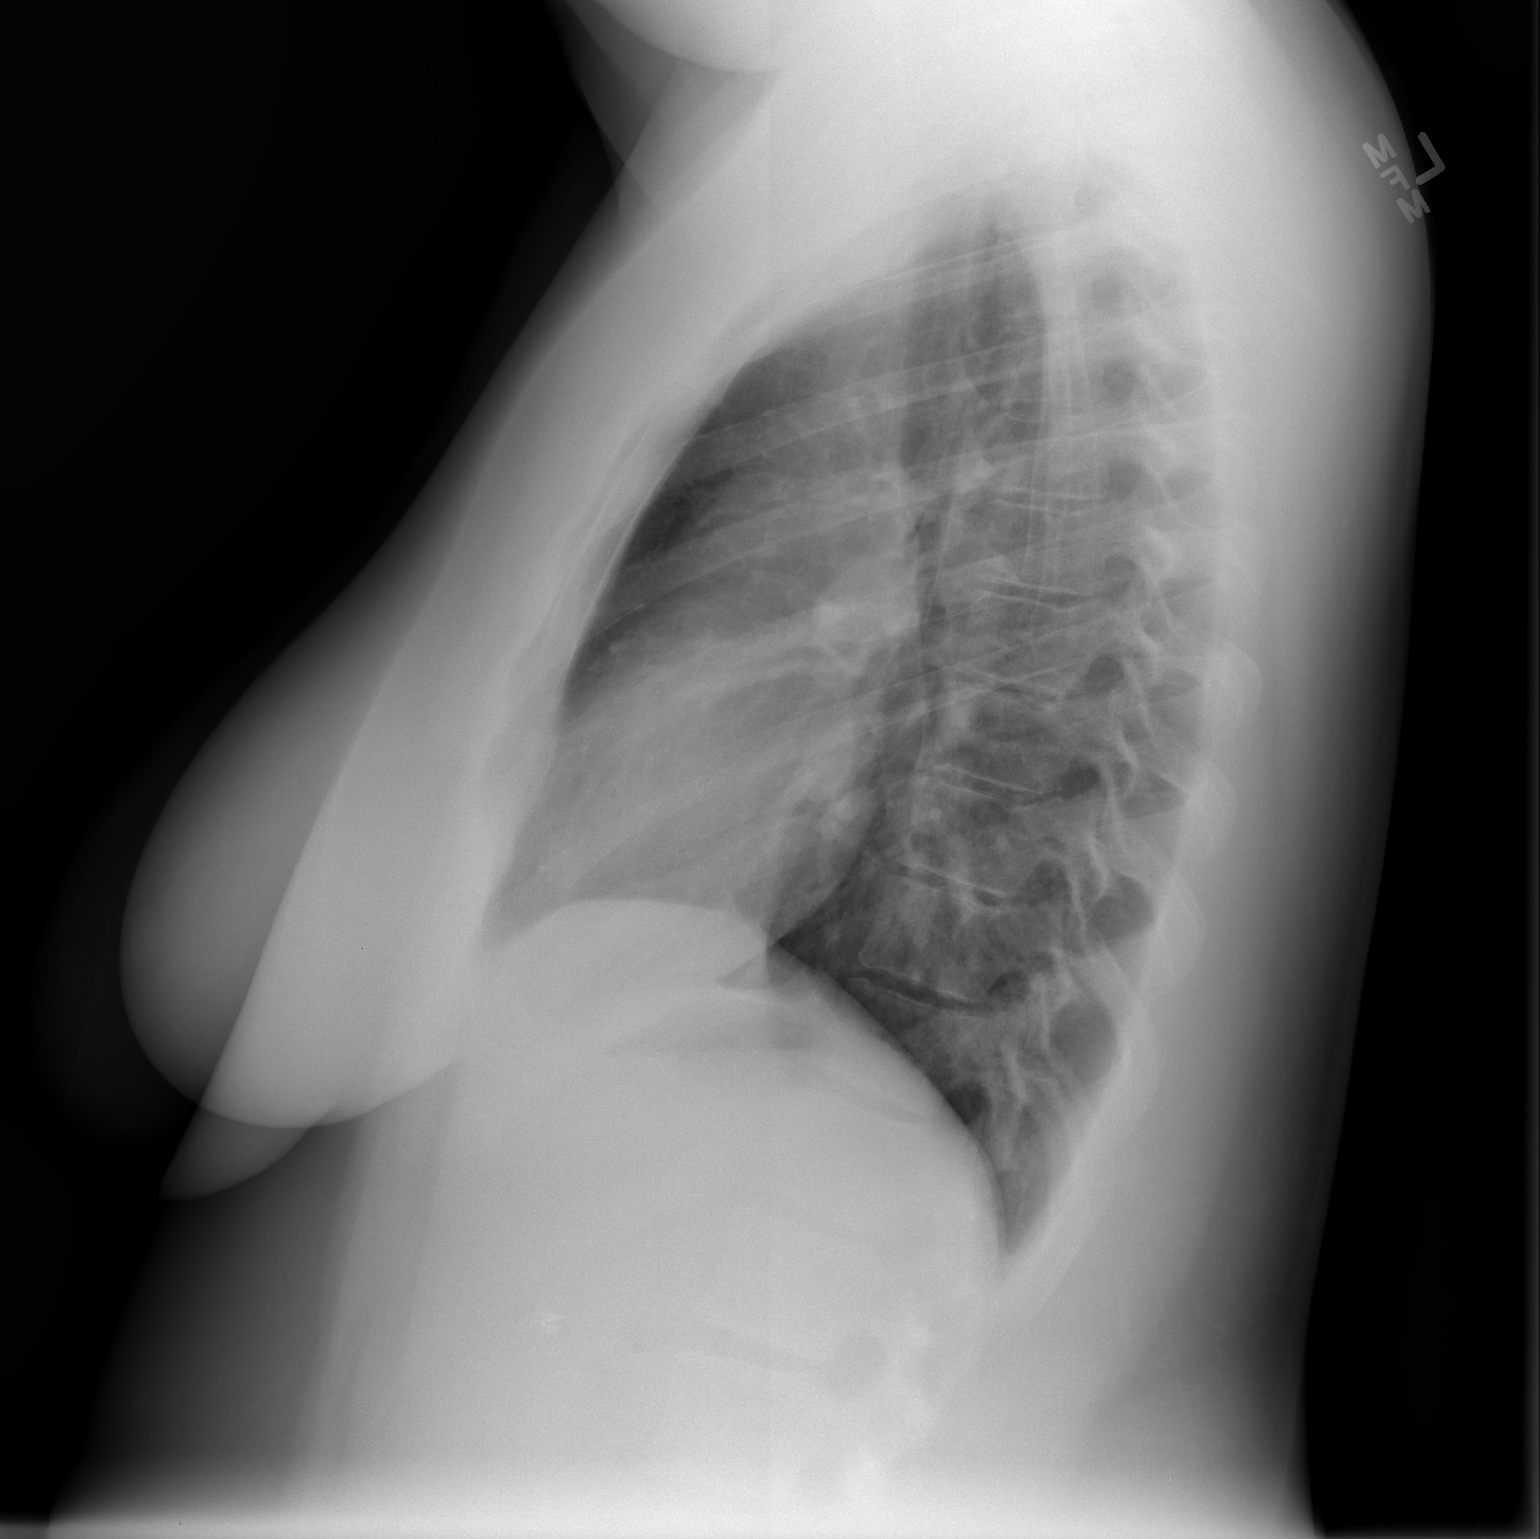

[2 of 2 positions shown; findings below may reference images not displayed]

FINDINGS: Heart and mediastinal contours are within normal limits.
The lung fields are clear with no signs of focal infiltrate or
congestive failure.  No pleural fluid or peribronchial cuffing is
seen.

Bony structures appear intact.  Surgical clips are identified in
the right upper quadrant.
IMPRESSION: Normal chest with no worrisome focal or acute abnormality
identified

## 2013-01-24 ENCOUNTER — Ambulatory Visit
Admission: RE | Admit: 2013-01-24 | Discharge: 2013-01-24 | Disposition: A | Discharge status: Home / Self Care | Payer: BC Managed Care – PPO | Source: Ambulatory Visit | Attending: Sports Medicine | Admitting: Sports Medicine

## 2013-01-24 ENCOUNTER — Ambulatory Visit (INDEPENDENT_AMBULATORY_CARE_PROVIDER_SITE_OTHER): Payer: BC Managed Care – PPO | Admitting: Sports Medicine

## 2013-01-24 VITALS — BP 114/77 | Ht 69.0 in | Wt 250.0 lb

## 2013-01-24 DIAGNOSIS — M25539 Pain in unspecified wrist: Secondary | ICD-10-CM

## 2013-01-24 DIAGNOSIS — M25532 Pain in left wrist: Secondary | ICD-10-CM

## 2013-01-24 MED ORDER — MELOXICAM 15 MG PO TABS: 15.0000 mg | ORAL_TABLET | Freq: Every day | ORAL | Status: DC

## 2013-01-25 NOTE — Progress Notes (Signed)
  Subjective:    Patient ID: Lynn Rodriguez, female    DOB: [birth year], 32 y.o.   MRN: [id number]  HPI chief complaint: Left wrist pain and swelling  32 year old right-hand-dominant female comes in today complaining of 2 weeks of left wrist pain and swelling. She acutely injured her wrist while working out. She was performing a clean and jerk when she felt a pop along the radial aspect of her wrist. Immediate pain and swelling which has persisted since then. Pain is most noticeable with grip and with wrist motion. Symptoms improve at rest. She was seen at a local urgent care and placed into a cockup wrist brace which has not been helpful. She's also been taking ibuprofen which hasn't been helpful. She has a history of a previous fracture in the same wrist as a kid which was treated conservatively with a cast. No prior wrist surgeries. No associated numbness or tingling. No pain more proximally in the elbow.  Past medical history is reviewed Current medications are reviewed Allergies are reviewed    Review of Systems as above     Objective:   Physical Exam Well-developed, well-nourished. No acute distress. Awake alert and oriented x3. Vital signs are reviewed  Left wrist: Limited range of motion secondary to pain. No effusion. There is some swelling along the first extensor compartment and tenderness to palpation here as well. Patient has active thumb abduction and extension but it causes pain. There is no tenderness along the ulnar aspect of the wrist. There is some pain in the anatomic snuff box. Good radial and ulnar pulses. Decreased grip strength secondary to pain.  MSK ultrasound of the left wrist with attention to the first extensor compartment was performed. Images were obtained in both long and short view. There appears to be a split tear through the extensor pollicis brevis tendon with surrounding fluid. X-rays of the left wrist including AP, lateral, and scaphoid views are  unremarkable.       Assessment & Plan:  1. Left wrist pain and swelling worrisome for first extensor compartment tendon tear  Given the traumatic nature of her onset of symptoms coupled with the ultrasound finding today I would like to go ahead with an MRI scan to further evaluate the extensors of the first extensor compartment. If a significant tear is seen that I would consider referral to a hand specialist for definitive treatment. In the meantime, I have given her a thumb spica brace to wear at all times she will start Mobic 15 mg daily for 10 days. Phone followup after the MRI to delineate further treatment.

## 2013-01-28 ENCOUNTER — Ambulatory Visit
Admission: RE | Admit: 2013-01-28 | Discharge: 2013-01-28 | Disposition: A | Discharge status: Home / Self Care | Payer: BC Managed Care – PPO | Source: Ambulatory Visit | Attending: Sports Medicine | Admitting: Sports Medicine

## 2013-01-28 DIAGNOSIS — M25532 Pain in left wrist: Secondary | ICD-10-CM

## 2013-02-01 ENCOUNTER — Telehealth: Payer: Self-pay | Admitting: Sports Medicine

## 2013-02-01 NOTE — Telephone Encounter (Signed)
I left a message on the patient's voice mail today regarding MRI findings of her wrist. There is some mild thickening in the first extensor compartment but no evidence of tendon tearing which was my concern after performing an MSK ultrasound. I reassured her of her findings and asked her to continue with her thumb spica brace for couple more weeks. Symptoms persist we could consider merits of a cortisone injection into the first extensor compartment. No definitive operative pathology is seen. Followup when necessary.

## 2013-03-10 ENCOUNTER — Ambulatory Visit (INDEPENDENT_AMBULATORY_CARE_PROVIDER_SITE_OTHER): Payer: BC Managed Care – PPO | Admitting: Sports Medicine

## 2013-03-10 VITALS — BP 113/78 | Ht 69.0 in | Wt 248.0 lb

## 2013-03-10 DIAGNOSIS — M25539 Pain in unspecified wrist: Secondary | ICD-10-CM

## 2013-03-10 DIAGNOSIS — M25532 Pain in left wrist: Secondary | ICD-10-CM

## 2013-03-10 MED ORDER — TRAMADOL HCL 50 MG PO TABS: 50.0000 mg | ORAL_TABLET | Freq: Four times a day (QID) | ORAL | Status: DC | PRN

## 2013-03-10 NOTE — Progress Notes (Signed)
  Subjective:    Patient ID: Lynn Rodriguez, female    DOB: 07/22/1980, 32 y.o.   MRN: [id number]  HPI Patient comes in today requesting a cortisone injection into her left wrist. She has been struggling with a strain of the first extensor compartment. Initial ultrasound suggested a possible tear of one of the tendons but a followup MRI did not confirm this. Instead the MRI showed findings consistent with mild first extensor tendon strain. Patient was placed on oral anti-inflammatories and given a thumb spica brace but despite that her symptoms persist. All of her pain is along the radial side of her wrist.    Review of Systems     Objective:   Physical Exam Well-developed, well-nourished. No acute distress  Left wrist: No soft tissue swelling. No effusion. Markedly positive Finkelstein's. Neurovascularly intact distally.       Assessment & Plan:  Persistent left wrist pain secondary to first compartment tendon strain/tendinitis  Patient's first extensor compartment in the left wrist was injected with 0.5 cc of Xylocaine and 0.5 cc of Depo-Medrol. This was done under sterile technique and under ultrasound guidance after risks and benefits of the procedure were explained including the possibility of hypopigmentation. Patient voiced her understanding of these risks. She tolerated the procedure without difficulty. I've asked that she resume wearing her thumb spica brace for another 4-5 days and then increase activity as tolerated. If symptoms persist I would consider merits of referral to one of the hand specialists. Followup when necessary.

## 2013-03-10 NOTE — Progress Notes (Signed)
Pt called back stating her wrist is very painful since having injection.  She has iced, taken 800 mg of ibuprofen and worn her brace.  Per Dr. Margaretha Sheffield, advised pt this is a steroid flare, and she should continue icing often, ok to continue ibuprofen, and Dr. Margaretha Sheffield ordered tramadol for her to take as needed.  Also suggested taking brace off and moving thumb around.

## 2013-03-10 NOTE — Addendum Note (Signed)
Addended by: Jacki Cones C on: 03/10/2013 03:24 PM   Modules accepted: Orders

## 2013-08-15 ENCOUNTER — Other Ambulatory Visit: Payer: Self-pay | Admitting: *Deleted

## 2013-08-15 MED ORDER — ACETAMINOPHEN-CODEINE #3 300-30 MG PO TABS: 1.0000 | ORAL_TABLET | Freq: Four times a day (QID) | ORAL | Status: DC | PRN

## 2013-08-15 NOTE — Progress Notes (Signed)
Pt called stating she is having progressively worsening wrist pain and swelling over the last week.  Dr. Margaretha Sheffieldraper referred her to Dr. Mina MarbleWeingold.  They will call her to set up appt.  Pt requested med for pain.  Ibuprofen, meloxicam, tramadol, not helpful.  Dr. Margaretha Sheffieldraper authorized tylenol #3.

## 2013-09-28 DIAGNOSIS — M654 Radial styloid tenosynovitis [de Quervain]: Secondary | ICD-10-CM

## 2013-09-28 HISTORY — DX: Radial styloid tenosynovitis (de quervain): M65.4

## 2013-10-05 ENCOUNTER — Other Ambulatory Visit: Payer: Self-pay | Admitting: Orthopedic Surgery

## 2013-10-18 ENCOUNTER — Encounter (HOSPITAL_BASED_OUTPATIENT_CLINIC_OR_DEPARTMENT_OTHER): Payer: Self-pay | Admitting: *Deleted

## 2013-10-20 NOTE — H&P (Signed)
Lynn HopperDana L Rodriguez is an 33 y.o. female.   CC / Reason for Visit: Left wrist pain HPI: This patient returns for reevaluation, noting that she had some modest improvement following the injection, but still with pain.  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  She reports the onset of this with lifting weights at the gym.  She has had an MRI scan, as well as some splinting and an injection performed by Dr. Margaretha Sheffieldraper in late July.  She reports that this helped, but her symptoms have recurred.   Past Medical History  Diagnosis Date  . ADHD (attention deficit hyperactivity disorder)     prn med.  . Arthritis     knees  . GERD (gastroesophageal reflux disease)     prn med.  . Asthma     prn inhaler  . De Quervain's tenosynovitis, left 09/2013  . Dental crown present     Past Surgical History  Procedure Laterality Date  . Cholecystectomy  08/23/2009    lap. chole.  . Tonsillectomy and adenoidectomy    . Knee arthroscopy Bilateral     History reviewed. No pertinent family history. Social History:  reports that she has never smoked. She has never used smokeless tobacco. She reports that she drinks alcohol. She reports that she does not use illicit drugs.  Allergies:  Allergies  Allergen Reactions  . Gelatin Other (See Comments)    SEVERE MIGRAINES  . Percocet [Oxycodone-Acetaminophen] Other (See Comments)    IRREGULAR HEARTBEAT  . Pork-Derived Products Other (See Comments)    SEVERE MIGRAINES  . Penicillins Rash    No prescriptions prior to admission    No results found for this or any previous visit (from the past 48 hour(s)). No results found.  Review of Systems  All other systems reviewed and are negative.   Height 5\' 9"  (1.753 m), weight 113.399 kg  (250 lb), last menstrual period 10/06/2013. Physical Exam  Constitutional:  WD, WN, NAD HEENT:  NCAT, EOMI Neuro/Psych:  Alert & oriented to person, place, and time; appropriate mood & affect Lymphatic: No generalized UE edema or lymphadenopathy Extremities / MSK:  Both UE are normal with respect to appearance, ranges of motion, joint stability, muscle strength/tone, sensation, & perfusion except as otherwise noted:  Left wrist tender radially over the first dorsal compartment tendons, with positive Lourena SimmondsFinkelstein and EPB stress.  No increased pain with TMC stress or grind testing.  No pain with palpation at the [facility]  Labs / Xrays:  No radiographic studies obtained today.  Assessment: Left wrist de Quervain's tenosynovitis  Plan:  We discussed options, and deliberated between continued nonoperative management versus surgery.  Ultimately she decided to proceed surgically.  She will sit with Olegario MessierKathy and decide upon an upcoming date. The details of the operative procedure were discussed with the patient.  Questions were invited and answered.  In addition to the goal of the procedure, the risks of the procedure to include but not limited to bleeding; infection; damage to the nerves or blood vessels that could result in bleeding, numbness, weakness, chronic pain, and the need for additional procedures; stiffness; the need for revision surgery; and anesthetic risks, the worst of which is death, were reviewed.  No specific outcome was guaranteed or implied.  Informed  consent was obtained.  Prescriptions for postoperative analgesia were also written.   Jodi Marbleavid A Jove Beyl 10/20/2013, 10:29 AM

## 2013-10-24 ENCOUNTER — Encounter (HOSPITAL_BASED_OUTPATIENT_CLINIC_OR_DEPARTMENT_OTHER)
Admission: RE | Disposition: A | Discharge status: Home / Self Care | Payer: Self-pay | Source: Ambulatory Visit | Attending: Orthopedic Surgery

## 2013-10-24 ENCOUNTER — Ambulatory Visit (HOSPITAL_BASED_OUTPATIENT_CLINIC_OR_DEPARTMENT_OTHER): Payer: BC Managed Care – PPO | Admitting: Anesthesiology

## 2013-10-24 ENCOUNTER — Encounter (HOSPITAL_BASED_OUTPATIENT_CLINIC_OR_DEPARTMENT_OTHER): Payer: BC Managed Care – PPO | Admitting: Anesthesiology

## 2013-10-24 ENCOUNTER — Ambulatory Visit (HOSPITAL_BASED_OUTPATIENT_CLINIC_OR_DEPARTMENT_OTHER)
Admission: RE | Admit: 2013-10-24 | Discharge: 2013-10-24 | Disposition: A | Discharge status: Home / Self Care | Payer: BC Managed Care – PPO | Source: Ambulatory Visit | Attending: Orthopedic Surgery | Admitting: Orthopedic Surgery

## 2013-10-24 ENCOUNTER — Encounter (HOSPITAL_BASED_OUTPATIENT_CLINIC_OR_DEPARTMENT_OTHER): Payer: Self-pay | Admitting: Anesthesiology

## 2013-10-24 DIAGNOSIS — M654 Radial styloid tenosynovitis [de Quervain]: Secondary | ICD-10-CM | POA: Insufficient documentation

## 2013-10-24 DIAGNOSIS — J45909 Unspecified asthma, uncomplicated: Secondary | ICD-10-CM | POA: Insufficient documentation

## 2013-10-24 DIAGNOSIS — F909 Attention-deficit hyperactivity disorder, unspecified type: Secondary | ICD-10-CM | POA: Insufficient documentation

## 2013-10-24 DIAGNOSIS — M1710 Unilateral primary osteoarthritis, unspecified knee: Secondary | ICD-10-CM | POA: Insufficient documentation

## 2013-10-24 DIAGNOSIS — IMO0002 Reserved for concepts with insufficient information to code with codable children: Secondary | ICD-10-CM

## 2013-10-24 DIAGNOSIS — F411 Generalized anxiety disorder: Secondary | ICD-10-CM | POA: Insufficient documentation

## 2013-10-24 DIAGNOSIS — K219 Gastro-esophageal reflux disease without esophagitis: Secondary | ICD-10-CM | POA: Insufficient documentation

## 2013-10-24 HISTORY — DX: Unspecified osteoarthritis, unspecified site: M19.90

## 2013-10-24 HISTORY — DX: Radial styloid tenosynovitis (de quervain): M65.4

## 2013-10-24 HISTORY — DX: Dental restoration status: Z98.811

## 2013-10-24 HISTORY — PX: DORSAL COMPARTMENT RELEASE: SHX5039

## 2013-10-24 SURGERY — RELEASE, FIRST DORSAL COMPARTMENT, HAND
Anesthesia: General | Site: Wrist | Laterality: Left

## 2013-10-24 MED ORDER — MIDAZOLAM HCL 2 MG/2ML IJ SOLN: 1.0000 mg | INTRAMUSCULAR | Status: DC | PRN

## 2013-10-24 MED ORDER — BUPIVACAINE HCL (PF) 0.25 % IJ SOLN
INTRAMUSCULAR | Status: AC
  Filled 2013-10-24: qty 30

## 2013-10-24 MED ORDER — PROMETHAZINE HCL 25 MG/ML IJ SOLN: 6.2500 mg | INTRAMUSCULAR | Status: DC | PRN

## 2013-10-24 MED ORDER — LACTATED RINGERS IV SOLN
INTRAVENOUS | Status: DC
  Administered 2013-10-24: 08:00:00 via INTRAVENOUS

## 2013-10-24 MED ORDER — BUPIVACAINE HCL (PF) 0.25 % IJ SOLN
INTRAMUSCULAR | Status: DC | PRN
  Administered 2013-10-24: 4.5 mL

## 2013-10-24 MED ORDER — PROPOFOL 10 MG/ML IV EMUL
INTRAVENOUS | Status: DC | PRN
  Administered 2013-10-24: 100 ug/kg/min via INTRAVENOUS

## 2013-10-24 MED ORDER — PROPOFOL 10 MG/ML IV BOLUS
INTRAVENOUS | Status: AC
  Filled 2013-10-24: qty 20

## 2013-10-24 MED ORDER — FENTANYL CITRATE 0.05 MG/ML IJ SOLN
INTRAMUSCULAR | Status: DC | PRN
  Administered 2013-10-24: 100 ug via INTRAVENOUS

## 2013-10-24 MED ORDER — LIDOCAINE-EPINEPHRINE (PF) 1 %-1:200000 IJ SOLN
INTRAMUSCULAR | Status: DC | PRN
  Administered 2013-10-24: 4.5 mL

## 2013-10-24 MED ORDER — DIPHENHYDRAMINE HCL 50 MG/ML IJ SOLN
12.5000 mg | Freq: Once | INTRAMUSCULAR | Status: AC
  Administered 2013-10-24: 12.5 mg via INTRAVENOUS

## 2013-10-24 MED ORDER — HYDROMORPHONE HCL PF 1 MG/ML IJ SOLN: 0.2500 mg | INTRAMUSCULAR | Status: DC | PRN

## 2013-10-24 MED ORDER — VANCOMYCIN HCL IN DEXTROSE 500-5 MG/100ML-% IV SOLN
INTRAVENOUS | Status: AC
  Filled 2013-10-24: qty 100

## 2013-10-24 MED ORDER — ONDANSETRON HCL 4 MG/2ML IJ SOLN
INTRAMUSCULAR | Status: DC | PRN
  Administered 2013-10-24: 4 mg via INTRAVENOUS

## 2013-10-24 MED ORDER — FENTANYL CITRATE 0.05 MG/ML IJ SOLN
INTRAMUSCULAR | Status: AC
  Filled 2013-10-24: qty 2

## 2013-10-24 MED ORDER — MIDAZOLAM HCL 2 MG/ML PO SYRP: 12.0000 mg | ORAL_SOLUTION | Freq: Once | ORAL | Status: DC | PRN

## 2013-10-24 MED ORDER — MIDAZOLAM HCL 5 MG/5ML IJ SOLN
INTRAMUSCULAR | Status: DC | PRN
  Administered 2013-10-24: 2 mg via INTRAVENOUS

## 2013-10-24 MED ORDER — PROPOFOL 10 MG/ML IV BOLUS
INTRAVENOUS | Status: DC | PRN
  Administered 2013-10-24: 50 mg via INTRAVENOUS

## 2013-10-24 MED ORDER — CHLORHEXIDINE GLUCONATE 4 % EX LIQD: 60.0000 mL | Freq: Once | CUTANEOUS | Status: DC

## 2013-10-24 MED ORDER — FENTANYL CITRATE 0.05 MG/ML IJ SOLN: 50.0000 ug | INTRAMUSCULAR | Status: DC | PRN

## 2013-10-24 MED ORDER — MIDAZOLAM HCL 2 MG/2ML IJ SOLN
INTRAMUSCULAR | Status: AC
  Filled 2013-10-24: qty 2

## 2013-10-24 MED ORDER — VANCOMYCIN HCL 10 G IV SOLR
1500.0000 mg | INTRAVENOUS | Status: AC
  Administered 2013-10-24: 1500 mg via INTRAVENOUS

## 2013-10-24 MED ORDER — DIPHENHYDRAMINE HCL 50 MG/ML IJ SOLN
INTRAMUSCULAR | Status: AC
  Filled 2013-10-24: qty 1

## 2013-10-24 SURGICAL SUPPLY — 42 items
BLADE 15 SAFETY STRL DISP (BLADE) ×3 IMPLANT
BLADE MINI RND TIP GREEN BEAV (BLADE) IMPLANT
BNDG CMPR 9X4 STRL LF SNTH (GAUZE/BANDAGES/DRESSINGS)
BNDG COHESIVE 3X5 TAN STRL LF (GAUZE/BANDAGES/DRESSINGS) ×1 IMPLANT
BNDG COHESIVE 4X5 TAN STRL (GAUZE/BANDAGES/DRESSINGS) ×2 IMPLANT
BNDG ESMARK 4X9 LF (GAUZE/BANDAGES/DRESSINGS) IMPLANT
BNDG GAUZE ELAST 4 BULKY (GAUZE/BANDAGES/DRESSINGS) ×6 IMPLANT
CHLORAPREP W/TINT 26ML (MISCELLANEOUS) ×3 IMPLANT
COVER MAYO STAND STRL (DRAPES) ×3 IMPLANT
COVER TABLE BACK 60X90 (DRAPES) ×3 IMPLANT
CUFF TOURNIQUET SINGLE 18IN (TOURNIQUET CUFF) IMPLANT
CUFF TOURNIQUET SINGLE 24IN (TOURNIQUET CUFF) ×2 IMPLANT
DRAPE EXTREMITY T 121X128X90 (DRAPE) ×3 IMPLANT
DRAPE SURG 17X23 STRL (DRAPES) ×3 IMPLANT
DRSG EMULSION OIL 3X3 NADH (GAUZE/BANDAGES/DRESSINGS) ×3 IMPLANT
GLOVE BIO SURGEON STRL SZ7.5 (GLOVE) ×3 IMPLANT
GLOVE BIOGEL PI IND STRL 7.0 (GLOVE) IMPLANT
GLOVE BIOGEL PI IND STRL 8 (GLOVE) ×1 IMPLANT
GLOVE BIOGEL PI INDICATOR 7.0 (GLOVE) ×4
GLOVE BIOGEL PI INDICATOR 8 (GLOVE) ×2
GLOVE ECLIPSE 6.5 STRL STRAW (GLOVE) ×2 IMPLANT
GOWN STRL REUS W/ TWL LRG LVL3 (GOWN DISPOSABLE) ×2 IMPLANT
GOWN STRL REUS W/TWL LRG LVL3 (GOWN DISPOSABLE) ×6
NDL HYPO 25X1 1.5 SAFETY (NEEDLE) IMPLANT
NDL SAFETY ECLIPSE 18X1.5 (NEEDLE) IMPLANT
NEEDLE HYPO 18GX1.5 SHARP (NEEDLE)
NEEDLE HYPO 25X1 1.5 SAFETY (NEEDLE) ×3 IMPLANT
NS IRRIG 1000ML POUR BTL (IV SOLUTION) ×3 IMPLANT
PACK BASIN DAY SURGERY FS (CUSTOM PROCEDURE TRAY) ×3 IMPLANT
PADDING CAST ABS 4INX4YD NS (CAST SUPPLIES) ×2
PADDING CAST ABS COTTON 4X4 ST (CAST SUPPLIES) ×1 IMPLANT
SPLINT PLASTER CAST XFAST 3X15 (CAST SUPPLIES) ×8 IMPLANT
SPLINT PLASTER XTRA FASTSET 3X (CAST SUPPLIES) ×16
SPONGE GAUZE 4X4 12PLY STER LF (GAUZE/BANDAGES/DRESSINGS) ×3 IMPLANT
STOCKINETTE 4X48 STRL (DRAPES) ×3 IMPLANT
SUT VICRYL RAPIDE 4-0 (SUTURE) ×2 IMPLANT
SUT VICRYL RAPIDE 4/0 PS 2 (SUTURE) IMPLANT
SYR BULB 3OZ (MISCELLANEOUS) ×2 IMPLANT
SYRINGE 10CC LL (SYRINGE) ×2 IMPLANT
TOWEL OR 17X24 6PK STRL BLUE (TOWEL DISPOSABLE) ×3 IMPLANT
TOWEL OR NON WOVEN STRL DISP B (DISPOSABLE) IMPLANT
UNDERPAD 30X30 INCONTINENT (UNDERPADS AND DIAPERS) ×3 IMPLANT

## 2013-10-24 NOTE — Anesthesia Postprocedure Evaluation (Signed)
Anesthesia Post Note  Patient: Murriel HopperDana L Nedved  Procedure(s) Performed: Procedure(s) (LRB): RELEASE DORSAL COMPARTMENT (DEQUERVAIN) LEFT WRIST (Left)  Anesthesia type: MAC  Patient location: PACU  Post pain: Pain level controlled  Post assessment: Patient's Cardiovascular Status Stable  Last Vitals:  Filed Vitals:   10/24/13 0900  BP:   Pulse: 50  Temp:   Resp: 17    Post vital signs: Reviewed and stable  Level of consciousness: sedated  Complications: No apparent anesthesia complications

## 2013-10-24 NOTE — Transfer of Care (Signed)
Immediate Anesthesia Transfer of Care Note  Patient: Lynn Rodriguez  Procedure(s) Performed: Procedure(s): RELEASE DORSAL COMPARTMENT (DEQUERVAIN) LEFT WRIST (Left)  Patient Location: PACU  Anesthesia Type:MAC  Level of Consciousness: awake, alert  and oriented  Airway & Oxygen Therapy: Patient Spontanous Breathing  Post-op Assessment: Report given to PACU RN and Post -op Vital signs reviewed and stable  Post vital signs: Reviewed and stable  Complications: No apparent anesthesia complications

## 2013-10-24 NOTE — Op Note (Signed)
10/24/2013  7:36 AM  PATIENT:  Lynn Rodriguez  33 y.o. female  PRE-OPERATIVE DIAGNOSIS:  Left wrist deQuervain's tenosynovitis  POST-OPERATIVE DIAGNOSIS:  Same  PROCEDURE:  Left wrist de Quervain's release and EPB tenosynovium debridement  SURGEON: Cliffton Astersavid A. Janee Mornhompson, MD  PHYSICIAN ASSISTANT: None  ANESTHESIA:  local and MAC  SPECIMENS:  None  DRAINS:   None  PREOPERATIVE INDICATIONS:  Lynn HopperDana L Junkins is a  33 y.o. 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  OPERATIVE IMPLANTS: None  OPERATIVE PROCEDURE:  After receiving prophylactic antibiotics, the patient was escorted to the operative theatre and placed in a supine position.  She was sedated. A surgical "time-out" was performed during which the planned procedure, proposed operative site, and the correct patient identity were compared to the operative consent and agreement confirmed by the circulating nurse according to current facility policy. The incision was marked and anesthetized with a mixture of lidocaine and Marcaine bearing epinephrine. Following application of a tourniquet to the operative extremity, the exposed skin was prepped with Chloraprep and draped in the usual sterile fashion.  The limb was exsanguinated with an Esmarch bandage and the tourniquet inflated to approximately 100mmHg higher than systolic BP.  An oblique incision was made over the radial aspect of the distal radius. Subcutaneous tissues were dissected with blunt and spreading dissection, elevating full-thickness flaps including the cutaneous nerves and veins in the flaps. The first dorsal compartment was identified. The retinaculum was  split in a Z-plasty fashion to allow for later reapproximation and expanded position. The distal flap palmarly based, more proximal flap with dorsally based. In addition, there was a septum dividing the APL and EPB tendons and this was excised. In addition to excising the septum, the EPB and thickened tenosynovium upon and this was debrided. The tendons were then returned to the bed, the wound copiously irrigated, and the flaps reapproximated with 4-0 Vicryl Rapide sutures in expanded position. This allowed for adequate space for the tendons without rubbing or friction. Tourniquet was released, additional hemostasis and necessary, and the skin was closed with 4-0 Vicryl Rapide running horizontal mattress suture. A light dressing was applied with a volar plaster component and she was taken to the recovery room in stable condition, breathing spontaneously  DISPOSITION: She will be discharged home with typical instructions, removing her splint in 3 days to begin skin mobility exercises and going back into her removable Velcro wrist splint at that time.  RTC 10-15 days.

## 2013-10-24 NOTE — Anesthesia Preprocedure Evaluation (Addendum)
Anesthesia Evaluation  Patient identified by MRN, date of birth, ID band Patient awake    Reviewed: Allergy & Precautions, H&P , NPO status , Patient's Chart, lab work & pertinent test results  History of Anesthesia Complications Negative for: history of anesthetic complications  Airway       Dental   Pulmonary asthma ,          Cardiovascular negative cardio ROS      Neuro/Psych Anxiety  Neuromuscular disease    GI/Hepatic   Endo/Other    Renal/GU      Musculoskeletal   Abdominal Normal abdominal exam  (+)   Peds  Hematology   Anesthesia Other Findings   Reproductive/Obstetrics                          Anesthesia Physical Anesthesia Plan  ASA: II  Anesthesia Plan:    Post-op Pain Management:    Induction: Intravenous  Airway Management Planned: LMA  Additional Equipment:   Intra-op Plan:   Post-operative Plan: Extubation in OR  Informed Consent: I have reviewed the patients History and Physical, chart, labs and discussed the procedure including the risks, benefits and alternatives for the proposed anesthesia with the patient or authorized representative who has indicated his/her understanding and acceptance.   Dental advisory given  Plan Discussed with: CRNA, Anesthesiologist and Surgeon  Anesthesia Plan Comments:         Anesthesia Quick Evaluation

## 2013-10-24 NOTE — Discharge Instructions (Addendum)
Discharge Instructions   You have a light dressing on your hand.  You may begin gentle motion of your fingers and hand immediately, but you should not do any heavy lifting or gripping.  Elevate your hand to reduce pain & swelling of the digits.  Ice over the operative site may be helpful to reduce pain & swelling.  DO NOT USE HEAT. Pain medicine has been prescribed for you.  Use your medicine as needed over the first 48 hours, and then you can begin to taper your use. You may use Tylenol in place of your prescribed pain medication, but not IN ADDITION to it. Leave the dressing in place until the third day after your surgery and then remove it, leaving it open to air. Go into your removable splint at that time, removing it several times daily to work on "skin mobility exercises" After the bandage has been removed you may shower, but do not soak the incision.  You may drive a car when you are off of prescription pain medications and can safely control your vehicle with both hands. We will address whether therapy will be required or not when you return to the office. You may have already made your follow-up appointment when we completed your preop visit.  If not, please call our office today or the next business day to make your return appointment for 10-15 days after surgery.   Please call [phone] during normal business hours or [phone] after hours for any problems. Including the following:  - excessive redness of the incisions - drainage for more than 4 days - fever of more than 101.5 F  *Please note that pain medications will not be refilled after hours or on weekends.    Post Anesthesia Home Care Instructions  Activity: Get plenty of rest for the remainder of the day. A responsible adult should stay with you for 24 hours following the procedure.  For the next 24 hours, DO NOT: -Drive a car -Advertising copywriterperate machinery -Drink alcoholic beverages -Take any medication unless instructed  by your physician -Make any legal decisions or sign important papers.  Meals: Start with liquid foods such as gelatin or soup. Progress to regular foods as tolerated. Avoid greasy, spicy, heavy foods. If nausea and/or vomiting occur, drink only clear liquids until the nausea and/or vomiting subsides. Call your physician if vomiting continues.  Special Instructions/Symptoms: Your throat may feel dry or sore from the anesthesia or the breathing tube placed in your throat during surgery. If this causes discomfort, gargle with warm salt water. The discomfort should disappear within 24 hours.   Call your surgeon if you experience:   1.  Fever over 101.0. 2.  Inability to urinate. 3.  Nausea and/or vomiting. 4.  Extreme swelling or bruising at the surgical site. 5.  Continued bleeding from the incision. 6.  Increased pain, redness or drainage from the incision. 7.  Problems related to your pain medication.

## 2013-10-24 NOTE — Interval H&P Note (Signed)
History and Physical Interval Note:  10/24/2013 7:36 AM  Lynn Rodriguez  has presented today for surgery, with the diagnosis of left wrist dequervains  The various methods of treatment have been discussed with the patient and family. After consideration of risks, benefits and other options for treatment, the patient has consented to  Procedure(s): RELEASE DORSAL COMPARTMENT (DEQUERVAIN) LEFT WRIST (Left) as a surgical intervention .  The patient's history has been reviewed, patient examined, no change in status, stable for surgery.  I have reviewed the patient's chart and labs.  Questions were answered to the patient's satisfaction.     Jodi Marbleavid A Yasmin Bronaugh

## 2013-10-25 ENCOUNTER — Encounter (HOSPITAL_BASED_OUTPATIENT_CLINIC_OR_DEPARTMENT_OTHER): Payer: Self-pay | Admitting: Orthopedic Surgery

## 2015-04-16 ENCOUNTER — Other Ambulatory Visit: Payer: Self-pay | Admitting: *Deleted

## 2015-04-16 NOTE — Telephone Encounter (Signed)
Denied Fluticasone Nasal Spray to [facility] [address] [phone]. Patient needs office visit last ov 04/27/14.

## 2015-07-18 ENCOUNTER — Ambulatory Visit (INDEPENDENT_AMBULATORY_CARE_PROVIDER_SITE_OTHER): Payer: BC Managed Care – PPO | Admitting: Sports Medicine

## 2015-07-18 VITALS — BP 132/79 | Ht 69.0 in | Wt 280.0 lb

## 2015-07-18 DIAGNOSIS — M25561 Pain in right knee: Secondary | ICD-10-CM

## 2015-07-18 MED ORDER — DICLOFENAC SODIUM 75 MG PO TBEC: 75.0000 mg | DELAYED_RELEASE_TABLET | Freq: Two times a day (BID) | ORAL | Status: DC

## 2015-07-18 NOTE — Progress Notes (Signed)
   Subjective:    Patient ID: Lynn Rodriguez, female    DOB: 05/17/1981, 35 y.o.   MRN: [id number]  HPI Patient is a 35 year-old female who presents for evaluation of right knee pain and swelling that began 1 week ago. At the time of the injury she was participating in a cross training class (which she had started 2 weeks prior to injury) and doing squats. Patient noted as she began to try and come up from a squat that she had a sharp pulling pain at the medial right knee followed by swelling of that knee within an hour. Since that time she has continued to have 8/10 pain at the knee and continued swelling. The pain is worse with weight bearing, walking, and walking up stairs. She has tried ibuprofen and naproxen for pain relief with minimal effect. She has noticed occasional popping, clicking, and catching in her right knee since the injury although she said this would happen occasionally prior to the injury as well. She previously had arthroscopic knee surgery for a meniscal tear in her right knee in 2001 and in her left knee for a meniscal tear in 2002-2003.     Review of Systems Otherwise negative except as specified in HPI    Objective:   Physical Exam General: African-american female resting on exam table in no acute distress Right knee: Trace to 1+ effusion present. No redness or visible gross abnormalities. Small scars from previous arthroscopic procedure noted. 0 to 120 degrees ROM of right knee on extension-flexion. Tender to palpation along medial joint line. Negative Lachman's and negative valgus stress test. Positive McMurrays. Left Knee: No effusion or redness present. Full range of motion of left knee intact Psych: Normal mood/affect with appropriate response to questions.     Assessment & Plan:  Right Knee Pain and Swelling worrisome for recurrent medial meniscal tear -Diclofenac  BID with food for 7 days to reduce pain and swelling -Body Helix knee compression sleeve -X-ray  right knee (standing, AP, Lateral, sunrise) -MRI right knee to rule out meniscal tear - Phone follow up after the MRI to discuss results and delineate a more definitive treatment plan.

## 2015-07-25 ENCOUNTER — Other Ambulatory Visit: Payer: BC Managed Care – PPO

## 2015-07-25 ENCOUNTER — Ambulatory Visit
Admission: RE | Admit: 2015-07-25 | Discharge: 2015-07-25 | Disposition: A | Discharge status: Home / Self Care | Payer: BC Managed Care – PPO | Source: Ambulatory Visit | Attending: Sports Medicine | Admitting: Sports Medicine

## 2015-07-25 DIAGNOSIS — M25561 Pain in right knee: Secondary | ICD-10-CM

## 2015-07-27 ENCOUNTER — Telehealth: Payer: Self-pay | Admitting: Sports Medicine

## 2015-07-27 NOTE — Telephone Encounter (Signed)
I spoke with the patient on the phone today after reviewing x-rays and MRI of her right knee. There is no obvious meniscal tear. 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 This certainly fits with her mechanism of injury, specifically squatting and lunging as part of a cross fit class. She is feeling better with the diclofenac and the knee sleeve but her pain has not completely resolved. I recommended that we give this a little more time with the oral anti-inflammatories and she will avoid squatting and bending at the knee until pain resolves. If symptoms persist then she will return to the office for a cortisone injection. Otherwise, follow-up as needed.

## 2015-08-23 ENCOUNTER — Emergency Department (HOSPITAL_COMMUNITY)
Admission: EM | Admit: 2015-08-23 | Discharge: 2015-08-23 | Disposition: A | Discharge status: Home / Self Care | Payer: Worker's Compensation | Attending: Emergency Medicine | Admitting: Emergency Medicine

## 2015-08-23 ENCOUNTER — Encounter (HOSPITAL_COMMUNITY): Payer: Self-pay | Admitting: *Deleted

## 2015-08-23 DIAGNOSIS — Z880 Allergy status to penicillin: Secondary | ICD-10-CM | POA: Diagnosis not present

## 2015-08-23 DIAGNOSIS — S4991XA Unspecified injury of right shoulder and upper arm, initial encounter: Secondary | ICD-10-CM | POA: Diagnosis not present

## 2015-08-23 DIAGNOSIS — Y998 Other external cause status: Secondary | ICD-10-CM | POA: Insufficient documentation

## 2015-08-23 DIAGNOSIS — Z7951 Long term (current) use of inhaled steroids: Secondary | ICD-10-CM | POA: Diagnosis not present

## 2015-08-23 DIAGNOSIS — Y92219 Unspecified school as the place of occurrence of the external cause: Secondary | ICD-10-CM | POA: Diagnosis not present

## 2015-08-23 DIAGNOSIS — S161XXA Strain of muscle, fascia and tendon at neck level, initial encounter: Secondary | ICD-10-CM | POA: Diagnosis not present

## 2015-08-23 DIAGNOSIS — Z8659 Personal history of other mental and behavioral disorders: Secondary | ICD-10-CM | POA: Insufficient documentation

## 2015-08-23 DIAGNOSIS — M170 Bilateral primary osteoarthritis of knee: Secondary | ICD-10-CM | POA: Diagnosis not present

## 2015-08-23 DIAGNOSIS — Z791 Long term (current) use of non-steroidal anti-inflammatories (NSAID): Secondary | ICD-10-CM | POA: Diagnosis not present

## 2015-08-23 DIAGNOSIS — J45909 Unspecified asthma, uncomplicated: Secondary | ICD-10-CM | POA: Diagnosis not present

## 2015-08-23 DIAGNOSIS — Y9389 Activity, other specified: Secondary | ICD-10-CM | POA: Diagnosis not present

## 2015-08-23 DIAGNOSIS — S3992XA Unspecified injury of lower back, initial encounter: Secondary | ICD-10-CM | POA: Diagnosis not present

## 2015-08-23 DIAGNOSIS — Z79899 Other long term (current) drug therapy: Secondary | ICD-10-CM | POA: Insufficient documentation

## 2015-08-23 DIAGNOSIS — S199XXA Unspecified injury of neck, initial encounter: Secondary | ICD-10-CM | POA: Diagnosis present

## 2015-08-23 DIAGNOSIS — M549 Dorsalgia, unspecified: Secondary | ICD-10-CM

## 2015-08-23 DIAGNOSIS — K219 Gastro-esophageal reflux disease without esophagitis: Secondary | ICD-10-CM | POA: Diagnosis not present

## 2015-08-23 DIAGNOSIS — S4992XA Unspecified injury of left shoulder and upper arm, initial encounter: Secondary | ICD-10-CM | POA: Diagnosis not present

## 2015-08-23 DIAGNOSIS — Z98811 Dental restoration status: Secondary | ICD-10-CM | POA: Diagnosis not present

## 2015-08-23 MED ORDER — NAPROXEN 500 MG PO TABS
500.0000 mg | ORAL_TABLET | Freq: Once | ORAL | Status: AC
  Administered 2015-08-23: 500 mg via ORAL
  Filled 2015-08-23: qty 1

## 2015-08-23 MED ORDER — NAPROXEN 500 MG PO TABS: 500.0000 mg | ORAL_TABLET | Freq: Two times a day (BID) | ORAL | Status: AC

## 2015-08-23 MED ORDER — METHOCARBAMOL 500 MG PO TABS: 500.0000 mg | ORAL_TABLET | Freq: Two times a day (BID) | ORAL | Status: DC

## 2015-08-23 MED ORDER — IBUPROFEN 800 MG PO TABS
800.0000 mg | ORAL_TABLET | Freq: Once | ORAL | Status: DC
  Filled 2015-08-23: qty 1

## 2015-08-23 MED ORDER — HYDROCODONE-ACETAMINOPHEN 5-325 MG PO TABS
1.0000 | ORAL_TABLET | Freq: Once | ORAL | Status: AC
  Administered 2015-08-23: 1 via ORAL
  Filled 2015-08-23: qty 1

## 2015-08-23 MED ORDER — HYDROCODONE-ACETAMINOPHEN 5-325 MG PO TABS: 1.0000 | ORAL_TABLET | Freq: Four times a day (QID) | ORAL | Status: DC | PRN
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
## 2015-08-25 ENCOUNTER — Emergency Department (HOSPITAL_BASED_OUTPATIENT_CLINIC_OR_DEPARTMENT_OTHER): Payer: Worker's Compensation

## 2015-08-25 ENCOUNTER — Encounter (HOSPITAL_BASED_OUTPATIENT_CLINIC_OR_DEPARTMENT_OTHER): Payer: Self-pay | Admitting: Emergency Medicine

## 2015-08-25 ENCOUNTER — Emergency Department (HOSPITAL_BASED_OUTPATIENT_CLINIC_OR_DEPARTMENT_OTHER)
Admission: EM | Admit: 2015-08-25 | Discharge: 2015-08-25 | Disposition: A | Discharge status: Home / Self Care | Payer: Worker's Compensation | Attending: Emergency Medicine | Admitting: Emergency Medicine

## 2015-08-25 DIAGNOSIS — Y9289 Other specified places as the place of occurrence of the external cause: Secondary | ICD-10-CM | POA: Diagnosis not present

## 2015-08-25 DIAGNOSIS — S29012A Strain of muscle and tendon of back wall of thorax, initial encounter: Secondary | ICD-10-CM | POA: Insufficient documentation

## 2015-08-25 DIAGNOSIS — Y9389 Activity, other specified: Secondary | ICD-10-CM | POA: Insufficient documentation

## 2015-08-25 DIAGNOSIS — M1990 Unspecified osteoarthritis, unspecified site: Secondary | ICD-10-CM | POA: Insufficient documentation

## 2015-08-25 DIAGNOSIS — F909 Attention-deficit hyperactivity disorder, unspecified type: Secondary | ICD-10-CM | POA: Insufficient documentation

## 2015-08-25 DIAGNOSIS — Z880 Allergy status to penicillin: Secondary | ICD-10-CM | POA: Diagnosis not present

## 2015-08-25 DIAGNOSIS — S29002A Unspecified injury of muscle and tendon of back wall of thorax, initial encounter: Secondary | ICD-10-CM | POA: Diagnosis present

## 2015-08-25 DIAGNOSIS — Z7951 Long term (current) use of inhaled steroids: Secondary | ICD-10-CM | POA: Insufficient documentation

## 2015-08-25 DIAGNOSIS — Z791 Long term (current) use of non-steroidal anti-inflammatories (NSAID): Secondary | ICD-10-CM | POA: Diagnosis not present

## 2015-08-25 DIAGNOSIS — Y990 Civilian activity done for income or pay: Secondary | ICD-10-CM | POA: Diagnosis not present

## 2015-08-25 DIAGNOSIS — S199XXA Unspecified injury of neck, initial encounter: Secondary | ICD-10-CM | POA: Insufficient documentation

## 2015-08-25 DIAGNOSIS — J45909 Unspecified asthma, uncomplicated: Secondary | ICD-10-CM | POA: Insufficient documentation

## 2015-08-25 DIAGNOSIS — K219 Gastro-esophageal reflux disease without esophagitis: Secondary | ICD-10-CM | POA: Diagnosis not present

## 2015-08-25 DIAGNOSIS — Z79899 Other long term (current) drug therapy: Secondary | ICD-10-CM | POA: Insufficient documentation

## 2015-08-25 DIAGNOSIS — Z98811 Dental restoration status: Secondary | ICD-10-CM | POA: Diagnosis not present

## 2015-08-25 NOTE — ED Notes (Signed)
Has had naproxen, vicodin and naproxen previously. Reports resolved sickness last week. (Denies: nvd, fever, bleeding, dizziness, sob, cough, weakness, urinary or vaginal sx, or other sx), pinpoints pain to bilateral shoulders/neck and lower back R>L.
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
## 2015-08-25 NOTE — ED Notes (Signed)
Pt walking to w/r to check on her mom. Pt alert, NAD, calm, interactive, resps e/u, speaking in clear completes sentences, steady gait. Returns to room, no changes. family at [facility].

## 2015-08-25 NOTE — ED Notes (Signed)
Dr. Madilyn Hook into room.

## 2015-08-25 NOTE — Discharge Instructions (Signed)
Thoracic Strain A thoracic strain, which is sometimes called a mid-back strain, is an injury to the muscles or tendons that attach to the upper part of your back behind your chest. This type of injury occurs when a muscle is overstretched or overloaded.  Thoracic strains can range from mild to severe. Mild strains may involve stretching a muscle or tendon without tearing it. These injuries may heal in 1-2 weeks. More severe strains involve tearing of muscle fibers or tendons. These will cause more pain and may take 6-8 weeks to heal. CAUSES This condition may be caused by:  An injury in which a sudden force is placed on the muscle.  Exercising without properly warming up.  Overuse of the muscle.  Improper form during certain movements.  Other injuries that surround or cause stress on the mid-back, causing a strain on the muscles. In some cases, the cause may not be known. RISK FACTORS This injury is more common in:  Athletes.  People with obesity. SYMPTOMS The main symptom of this condition is pain, especially with movement. Other symptoms include:  Bruising.  Swelling.  Spasm. DIAGNOSIS This condition may be diagnosed with a physical exam. X-rays may be taken to check for a fracture. TREATMENT This condition may be treated with:  Resting and icing the injured area.  Physical therapy. This will involve doing stretching and strengthening exercises.  Medicines for pain and inflammation. HOME CARE INSTRUCTIONS  Rest as needed. Follow instructions from your health care provider about any restrictions on activity.  If directed, apply ice to the injured area:  Put ice in a plastic bag.  Place a towel between your skin and the bag.  Leave the ice on for 20 minutes, 2-3 times per day.  Take over-the-counter and prescription medicines only as told by your health care provider.  Begin doing exercises as told by your health care provider or physical therapist.  Always  warm up properly before physical activity or sports.  Bend your knees before you lift heavy objects.  Keep all follow-up visits as told by your health care provider. This is important. SEEK MEDICAL CARE IF:  Your pain is not helped by medicine.  Your pain, bruising, or swelling is getting worse.  You have a fever. SEEK IMMEDIATE MEDICAL CARE IF:  You have shortness of breath.  You have chest pain.  You develop numbness or weakness in your legs.  You have involuntary loss of urine (urinary incontinence).   This information is not intended to replace advice given to you by your health care provider. Make sure you discuss any questions you have with your health care provider.   Document Released: 09/06/2003 Document Revised: 03/07/2015 Document Reviewed: 08/10/2014 Elsevier Interactive Patient Education 2016 [employer] Inc[employer]  
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
## 2016-08-28 ENCOUNTER — Ambulatory Visit: Payer: BC Managed Care – PPO | Admitting: Sports Medicine

## 2016-10-20 ENCOUNTER — Encounter (INDEPENDENT_AMBULATORY_CARE_PROVIDER_SITE_OTHER): Payer: Self-pay

## 2016-10-20 ENCOUNTER — Encounter: Payer: BC Managed Care – PPO | Admitting: Family Medicine

## 2016-10-20 NOTE — Progress Notes (Signed)
This encounter was created in error - please disregard.

## 2021-10-11 ENCOUNTER — Other Ambulatory Visit: Payer: Self-pay

## 2021-10-11 ENCOUNTER — Emergency Department (HOSPITAL_BASED_OUTPATIENT_CLINIC_OR_DEPARTMENT_OTHER): Payer: 59

## 2021-10-11 ENCOUNTER — Emergency Department (HOSPITAL_BASED_OUTPATIENT_CLINIC_OR_DEPARTMENT_OTHER)
Admission: EM | Admit: 2021-10-11 | Discharge: 2021-10-11 | Disposition: A | Discharge status: Home / Self Care | Payer: 59 | Attending: Emergency Medicine | Admitting: Emergency Medicine

## 2021-10-11 ENCOUNTER — Encounter (HOSPITAL_BASED_OUTPATIENT_CLINIC_OR_DEPARTMENT_OTHER): Payer: Self-pay | Admitting: *Deleted

## 2021-10-11 DIAGNOSIS — M5432 Sciatica, left side: Secondary | ICD-10-CM | POA: Insufficient documentation

## 2021-10-11 DIAGNOSIS — M79605 Pain in left leg: Secondary | ICD-10-CM | POA: Diagnosis present

## 2021-10-11 MED ORDER — HYDROCODONE-ACETAMINOPHEN 5-325 MG PO TABS: 1.0000 | ORAL_TABLET | Freq: Four times a day (QID) | ORAL | 0 refills | Status: DC | PRN

## 2021-10-11 NOTE — ED Triage Notes (Signed)
Left leg pain x 1 week. States was on a flight Tuesday and since then the pain has gotten much worse. She is supposed to fly out of [place] tonight to [place] and urgent care sent her here to r/o dvt ?

## 2021-10-11 NOTE — ED Provider Notes (Signed)
?[facility] EMERGENCY DEPARTMENT ?Provider Note ? ? ?CSN: [id number] ?Arrival date & time: 10/11/21  1956 ? ?  ? ?History ? ?Chief Complaint  ?Patient presents with  ? Leg Pain  ? ? ?Lynn Rodriguez is a 41 y.o. female. ? ?Patient is a 41 year old female with a history of back pain and sciatica and knee pain that she had a scope done for in December who is presenting with pain in her left buttocks that goes down her leg to behind her knee.  This has been present before but for the last week it has been worse.  Patient did recently move here from [place] and reported she did not do any heavy lifting with the move but she had been under chiropractor's care while she was in [place] and she has not seen anybody for the last 3 weeks here.  She is taking 8 mg of tizanidine at night but feels very tight.  She has not noticed any swelling in her legs numbness or tingling.  No weakness of the left leg.  She has some mild back pain but feels that that is her baseline.  Patient reports she cannot have ibuprofen products because she is allergic to them and they make her swell.  She is taking Tylenol for the pain but reports it is just not helping.  She was seen by urgent care today who sent her here to ensure she did not have a DVT.  Patient plans on getting on a plane for 24-hour travel tomorrow to go to [place] and wanted to make sure everything was okay.  She denies any chest pain or shortness of breath.  No prior history of clots. ? ?The history is provided by the patient.  ?Leg Pain ?Location:  Buttock and leg ? ?  ? ?Home Medications ?Prior to Admission medications   ?Medication Sig Start Date End Date Taking? Authorizing Provider  ?HYDROcodone-acetaminophen (NORCO/VICODIN) 5-325 MG tablet Take 1 tablet by mouth every 6 (six) hours as needed for severe pain. 10/11/21  Yes Daniesha Driver, Alphonzo Lemmings, MD  ?albuterol (PROAIR HFA) 108 (90 BASE) MCG/ACT inhaler Inhale 2 puffs into the lungs every 6 (six) hours as needed. For  shortness of breath     [provider]  ?albuterol (PROAIR HFA) 108 (90 Base) MCG/ACT inhaler Inhale into the lungs. 02/08/14   [provider]  ?albuterol (PROVENTIL) (2.5 MG/3ML) 0.083% nebulizer solution 2.5 mg. 02/13/14   [provider]  ?amphetamine-dextroamphetamine (ADDERALL) 10 MG tablet Take 10 mg by mouth daily as needed.    [provider]  ?beclomethasone (QVAR) 80 MCG/ACT inhaler Inhale 2 puffs into the lungs 2 (two) times daily.    [provider]  ?calcium carbonate (OS-CAL - DOSED IN MG OF ELEMENTAL CALCIUM) 1250 (500 Ca) MG tablet Take by mouth.    [provider]  ?cetirizine (ZYRTEC) 10 MG chewable tablet Chew 1 tablet (10 mg total) by mouth daily. 02/13/11 02/13/12  Donato Schultz, DO  ?cetirizine (ZYRTEC) 10 MG tablet Take 10 mg by mouth daily.    [provider]  ?Cholecalciferol (D 2000) 2000 units TABS Frequency:   Dosage:0.0     Instructions:  Note: 07/31/10   [provider]  ?cholecalciferol (VITAMIN D) 1000 UNITS tablet Take 5,000 Units by mouth daily.    [provider]  ?clonazePAM (KLONOPIN) 0.5 MG tablet Take 0.5 mg by mouth. 11/13/14 11/13/15  [provider]  ?cyclobenzaprine (FLEXERIL) 5 MG tablet Take by mouth. 01/10/15  [provider]  ?diclofenac (VOLTAREN) 75 MG EC tablet Take 1 tablet (75 mg total) by mouth 2 (two) times daily. 07/18/15   Ralene Cork, DO  ?DULERA 200-5 MCG/ACT AERO USE TWO PUFFS EVERY 12 HOURS TO PREVENT COUGH OR WHEEZING RINSE,GARGLE, AND SPIT AFTER USE 05/30/15   [provider]  ?fluticasone (FLONASE) 50 MCG/ACT nasal spray Place 2 sprays into both nostrils daily.    [provider]  ?fluticasone (FLONASE) 50 MCG/ACT nasal spray Frequency:Daily   Dosage:1   MCG/ACT  Instructions:  Note:USE 1 TO 2 SPRAYS IN EACH NOSTRIL ONCE DAILY. 06/03/13   [provider]  ?ibuprofen (ADVIL,MOTRIN) 600 MG tablet take 1 tablet by mouth every 6  to 8 hours if needed for pain 04/30/15   [provider]  ?levocetirizine (XYZAL) 5 MG tablet Take 5 mg by mouth every evening.    [provider]  ?Linaclotide Karlene Einstein) 145 MCG CAPS capsule Take by mouth. 08/23/14   [provider]  ?methocarbamol (ROBAXIN) 500 MG tablet Take 1 tablet (500 mg total) by mouth 2 (two) times daily. 08/23/15   Eliseo Squires, PA-C  ?mometasone-formoterol [facility]) 100-5 MCG/ACT AERO Inhale into the lungs.    [provider]  ?Multiple Vitamin (MULTI-VITAMINS) TABS Take by mouth.    [provider]  ?Multiple Vitamins-Minerals (MULTIVITAL PO) Take by mouth.    [provider]  ?naproxen (NAPROSYN) 500 MG tablet Take 1 tablet (500 mg total) by mouth 2 (two) times daily. 08/23/15   Eliseo Squires, PA-C  ?ranitidine (ZANTAC) 150 MG tablet Take 150 mg by mouth 2 (two) times daily as needed. For indigestion     [provider]  ?SUMAtriptan (IMITREX) 50 MG tablet Take 50 mg by mouth. 10/24/14 10/25/15  [provider]  ?VIMOVO 500-20 MG TBEC Take 1 tablet by mouth 2 (two) times daily. 05/07/15   [provider]  ?   ? ?Allergies    ?Gelatin, Percocet [oxycodone-acetaminophen], Pork-derived products, Symbicort [budesonide-formoterol fumarate], Amoxicillin, Penicillins, and Singulair [montelukast]   ? ?Review of Systems   ?Review of Systems ? ?Physical Exam ?Updated Vital Signs ?BP (!) 141/84   Pulse 79   Temp 99 ?F (37.2 ?C) (Oral)   Resp 18   Ht 5\' 9"  (1.753 m)   LMP 09/20/2021   SpO2 100%   BMI 41.35 kg/m?  ?Physical Exam ?Vitals and nursing note reviewed.  ?Constitutional:   ?   Appearance: Normal appearance.  ?Cardiovascular:  ?   Rate and Rhythm: Normal rate.  ?Pulmonary:  ?   Effort: Pulmonary effort is normal.  ?Musculoskeletal:     ?   General: Tenderness present.  ?   Comments: Tenderness with palpation at the sciatic notch on the left and down the hamstring with palpable spasm.  No calf  tenderness  ?Skin: ?   General: Skin is warm and dry.  ?Neurological:  ?   Mental Status: She is alert. Mental status is at baseline.  ?   Sensory: No sensory deficit.  ?   Motor: No weakness.  ?   Gait: Gait normal.  ? ? ?ED Results / Procedures / Treatments   ?Labs ?(all labs ordered are listed, but only abnormal results are displayed) ?Labs Reviewed - No data to display ? ?EKG ?None ? ?Radiology ?09/22/2021 Venous Img Lower Unilateral Left ? ?Result Date: 10/11/2021 ?CLINICAL DATA:  Left leg pain for 3 days EXAM: LEFT LOWER EXTREMITY VENOUS DOPPLER ULTRASOUND TECHNIQUE: Gray-scale sonography with compression,  as well as color and duplex ultrasound, were performed to evaluate the deep venous system(s) from the level of the common femoral vein through the popliteal and proximal calf veins. COMPARISON:  None. FINDINGS: VENOUS Normal compressibility of the common femoral, superficial femoral, and popliteal veins, as well as the visualized calf veins. Visualized portions of profunda femoral vein and great saphenous vein unremarkable. No filling defects to suggest DVT on grayscale or color Doppler imaging. Doppler waveforms show normal direction of venous flow, normal respiratory plasticity and response to augmentation. Limited views of the contralateral common femoral vein are unremarkable. OTHER None. Limitations: none IMPRESSION: 1. No evidence of deep venous thrombosis within the left lower extremity. Electronically Signed   By: Sharlet SalinaMichael  Brown M.D.   On: 10/11/2021 20:46   ? ?Procedures ?Procedures  ? ? ?Medications Ordered in ED ?Medications - No data to display ? ?ED Course/ Medical Decision Making/ A&P ?  ?                        ?Medical Decision Making ?Amount and/or Complexity of Data Reviewed ?Radiology: ordered. Decision-making details documented in ED Course. ? ?Risk ?Prescription drug management. ? ? ?Patient is a 41 year old female presenting today with leg pain.  She is planning on traveling to [place] tomorrow  and was seen at urgent care and sent here to rule out any evidence of DVT.  I have visualized patient's ultrasound and radiology reports no evidence of DVT at this time.  Feel that patient's symptoms are more classic

## 2021-10-11 NOTE — Discharge Instructions (Signed)
You can take the tizanidine every 6 hours as needed.  Do not take it exactly at the same time with the hydrocodone.  Also heating pad and massage may help be helpful.  Make sure you are getting up and walking around the plane to avoid getting too stiff. ?

## 2021-11-05 ENCOUNTER — Other Ambulatory Visit: Payer: Self-pay | Admitting: Orthopedic Surgery

## 2021-11-05 DIAGNOSIS — M259 Joint disorder, unspecified: Secondary | ICD-10-CM

## 2021-11-28 ENCOUNTER — Ambulatory Visit
Admission: RE | Admit: 2021-11-28 | Discharge: 2021-11-28 | Disposition: A | Discharge status: Home / Self Care | Payer: BC Managed Care – PPO | Source: Ambulatory Visit | Attending: Orthopedic Surgery | Admitting: Orthopedic Surgery

## 2021-11-28 ENCOUNTER — Ambulatory Visit
Admission: RE | Admit: 2021-11-28 | Discharge: 2021-11-28 | Disposition: A | Discharge status: Home / Self Care | Payer: 59 | Source: Ambulatory Visit | Attending: Orthopedic Surgery | Admitting: Orthopedic Surgery

## 2021-11-28 DIAGNOSIS — M259 Joint disorder, unspecified: Secondary | ICD-10-CM

## 2021-12-16 ENCOUNTER — Encounter (HOSPITAL_BASED_OUTPATIENT_CLINIC_OR_DEPARTMENT_OTHER): Payer: Self-pay | Admitting: Physical Therapy

## 2021-12-16 ENCOUNTER — Ambulatory Visit (HOSPITAL_BASED_OUTPATIENT_CLINIC_OR_DEPARTMENT_OTHER): Payer: 59 | Attending: Orthopedic Surgery | Admitting: Physical Therapy

## 2021-12-16 DIAGNOSIS — M25552 Pain in left hip: Secondary | ICD-10-CM | POA: Insufficient documentation

## 2021-12-16 DIAGNOSIS — M5459 Other low back pain: Secondary | ICD-10-CM | POA: Diagnosis present

## 2021-12-16 NOTE — Therapy (Incomplete)
OUTPATIENT PHYSICAL THERAPY LOWER EXTREMITY EVALUATION   Patient Name: Lynn Rodriguez MRN: [id number] DOB:May 15, 1981, 41 y.o., female Today's Date: 12/16/2021    Past Medical History:  Diagnosis Date   ADHD (attention deficit hyperactivity disorder)    prn med.   Arthritis    knees   Asthma    prn inhaler   De Quervain's tenosynovitis, left 09/2013   Dental crown present    GERD (gastroesophageal reflux disease)    prn med.   Past Surgical History:  Procedure Laterality Date   CHOLECYSTECTOMY  08/23/2009   lap. chole.   DORSAL COMPARTMENT RELEASE Left 10/24/2013   Procedure: RELEASE DORSAL COMPARTMENT (DEQUERVAIN) LEFT WRIST;  Surgeon: Jodi Marble, MD;  Location: [facility];  Service: Orthopedics;  Laterality: Left;   KNEE ARTHROSCOPY Bilateral    TONSILLECTOMY AND ADENOIDECTOMY     Patient Active Problem List   Diagnosis Date Noted   Vitamin D deficiency 12/05/2010   NAUSEA AND VOMITING 03/08/2010   MYALGIA 01/30/2010   CONTACT DERMATITIS&OTHER ECZEMA DUE UNSPEC CAUSE 01/09/2010   CANDIDIASIS OF VULVA AND VAGINA 12/21/2009   URI 12/21/2009   PLANTAR FASCIITIS 10/25/2009   MUSCLE SPASM, TRAPEZIUS 10/25/2009   MUSCLE CRAMPS, FOOT 10/25/2009   ASTHMA NOS W/ACUTE EXACERBATION 08/29/2009   STREPTOCOCCAL SORE THROAT 07/24/2008   ABDOMINAL PAIN RIGHT LOWER QUADRANT 05/08/2008   ALLERGIC ASTHMA 08/19/2007   COUGH 08/19/2007   GERD 08/06/2007   PELVIC  PAIN 04/28/2007   DERMATOPHYTOSIS, FOOT 12/14/2006   Acute sinusitis, unspecified 12/14/2006   ALLERGIC RHINITIS 12/14/2006    PCP:  Dr Tracey Harries   REFERRING PROVIDER: Dr Venita Lick   REFERRING DIAG: M54.51 (ICD-10-CM) - Vertebrogenic low back pain  THERAPY DIAG:  No diagnosis found.  Rationale for Evaluation and Treatment Rehabilitation  ONSET DATE:   SUBJECTIVE:   SUBJECTIVE STATEMENT: ***  PERTINENT HISTORY: ***  PAIN:  Are you having pain? Yes: NPRS scale: 2/10 Pain location:  *** Pain description: dull, achey Aggravating factors: Sitting,  Relieving factors: Ice, heat, stretching  PRECAUTIONS: None  WEIGHT BEARING RESTRICTIONS No  FALLS:  Has patient fallen in last 6 months? No  LIVING ENVIRONMENT: Lives with: lives with their family Lives in: House/apartment Stairs: Yes: Internal: *** steps; {rails:26871} Has following equipment at home: None  OCCUPATION: Works for non-profit  PLOF: Independent  PATIENT GOALS Decrease pain, strengthen hip and knee   OBJECTIVE:   DIAGNOSTIC FINDINGS: ***  PATIENT SURVEYS:  {rehab surveys:24030}  COGNITION:  Overall cognitive status: Within functional limits for tasks assessed     SENSATION: {sensation:27233}  EDEMA:  {edema:24020}  MUSCLE LENGTH: Hamstrings: Right *** deg; Left *** deg Thomas test: Right *** deg; Left *** deg  POSTURE: {posture:25561}  PALPATION: ***  LOWER EXTREMITY ROM:  {AROM/PROM:27142} ROM Right eval Left eval  Hip flexion    Hip extension    Hip abduction    Hip adduction    Hip internal rotation    Hip external rotation    Knee flexion    Knee extension  +5  Ankle dorsiflexion    Ankle plantarflexion    Ankle inversion    Ankle eversion     (Blank rows = not tested)  LOWER EXTREMITY MMT:  MMT Right eval Left eval  Hip flexion 46.4 44.5  Hip extension    Hip abduction 64.1 67.9  Hip adduction    Hip internal rotation    Hip external rotation    Knee flexion    Knee  extension 64.4` 48.0  Ankle dorsiflexion    Ankle plantarflexion    Ankle inversion    Ankle eversion     (Blank rows = not tested)  LOWER EXTREMITY SPECIAL TESTS:  Hip special tests: {HIP SPECIAL TESTS:26239}  Straight leg raise - + at 60deg  FUNCTIONAL TESTS:  {Functional tests:24029}  GAIT: Distance walked: *** Assistive device utilized: {Assistive devices:23999} Level of assistance: {Levels of assistance:24026} Comments: ***    TODAY'S TREATMENT: ***   PATIENT  EDUCATION:  Education details: *** Person educated: Patient Education method: Explanation Education comprehension: verbalized understanding   HOME EXERCISE PROGRAM: ***  ASSESSMENT:  CLINICAL IMPRESSION: Patient is a 41 y.o. F who was seen today for physical therapy evaluation and treatment for ***.    OBJECTIVE IMPAIRMENTS {opptimpairments:25111}.   ACTIVITY LIMITATIONS {activitylimitations:27494}  PARTICIPATION LIMITATIONS: {participationrestrictions:25113}  PERSONAL FACTORS {Personal factors:25162} are also affecting patient's functional outcome.   REHAB POTENTIAL: {rehabpotential:25112}  CLINICAL DECISION MAKING: {clinical decision making:25114}  EVALUATION COMPLEXITY: {Evaluation complexity:25115}   GOALS: Goals reviewed with patient? {yes/no:20286}  SHORT TERM GOALS: Target date: {follow up:25551}  *** Baseline: Goal status: {GOALSTATUS:25110}  2.  *** Baseline:  Goal status: {GOALSTATUS:25110}  3.  *** Baseline:  Goal status: {GOALSTATUS:25110}  4.  *** Baseline:  Goal status: {GOALSTATUS:25110}  5.  *** Baseline:  Goal status: {GOALSTATUS:25110}  6.  *** Baseline:  Goal status: {GOALSTATUS:25110}  LONG TERM GOALS: Target date: {follow up:25551}   *** Baseline:  Goal status: {GOALSTATUS:25110}  2.  *** Baseline:  Goal status: {GOALSTATUS:25110}  3.  *** Baseline:  Goal status: {GOALSTATUS:25110}  4.  *** Baseline:  Goal status: {GOALSTATUS:25110}  5.  *** Baseline:  Goal status: {GOALSTATUS:25110}  6.  *** Baseline:  Goal status: {GOALSTATUS:25110}   PLAN: PT FREQUENCY: {rehab frequency:25116}  PT DURATION: {rehab duration:25117}  PLANNED INTERVENTIONS: {rehab planned interventions:25118::"Therapeutic exercises","Therapeutic activity","Neuromuscular re-education","Balance training","Gait training","Patient/Family education","Joint mobilization"}  PLAN FOR NEXT SESSION: ***   Dessie Coma, PT 12/16/2021, 12:58 PM

## 2021-12-17 NOTE — Therapy (Signed)
OUTPATIENT PHYSICAL THERAPY LOWER EXTREMITY EVALUATION   Patient Name: Lynn Rodriguez MRN: [id number] DOB:10-02-1980, 41 y.o., female Today's Date: 12/16/2021   PT End of Session - 12/17/21 1607     Visit Number 1    Number of Visits 13    Date for PT Re-Evaluation 01/28/22    PT Start Time 1302    PT Stop Time 1345    PT Time Calculation (min) 43 min    Activity Tolerance Patient tolerated treatment well    Behavior During Therapy WFL for tasks assessed/performed             Past Medical History:  Diagnosis Date   ADHD (attention deficit hyperactivity disorder)    prn med.   Arthritis    knees   Asthma    prn inhaler   De Quervain's tenosynovitis, left 09/2013   Dental crown present    GERD (gastroesophageal reflux disease)    prn med.   Past Surgical History:  Procedure Laterality Date   CHOLECYSTECTOMY  08/23/2009   lap. chole.   DORSAL COMPARTMENT RELEASE Left 10/24/2013   Procedure: RELEASE DORSAL COMPARTMENT (DEQUERVAIN) LEFT WRIST;  Surgeon: Jodi Marble, MD;  Location: [facility];  Service: Orthopedics;  Laterality: Left;   KNEE ARTHROSCOPY Bilateral    TONSILLECTOMY AND ADENOIDECTOMY     Patient Active Problem List   Diagnosis Date Noted   Vitamin D deficiency 12/05/2010   NAUSEA AND VOMITING 03/08/2010   MYALGIA 01/30/2010   CONTACT DERMATITIS&OTHER ECZEMA DUE UNSPEC CAUSE 01/09/2010   CANDIDIASIS OF VULVA AND VAGINA 12/21/2009   URI 12/21/2009   PLANTAR FASCIITIS 10/25/2009   MUSCLE SPASM, TRAPEZIUS 10/25/2009   MUSCLE CRAMPS, FOOT 10/25/2009   ASTHMA NOS W/ACUTE EXACERBATION 08/29/2009   STREPTOCOCCAL SORE THROAT 07/24/2008   ABDOMINAL PAIN RIGHT LOWER QUADRANT 05/08/2008   ALLERGIC ASTHMA 08/19/2007   COUGH 08/19/2007   GERD 08/06/2007   PELVIC  PAIN 04/28/2007   DERMATOPHYTOSIS, FOOT 12/14/2006   Acute sinusitis, unspecified 12/14/2006   ALLERGIC RHINITIS 12/14/2006    PCP:  Dr Tracey Harries   REFERRING PROVIDER: Dr  Venita Lick   REFERRING DIAG: M54.51 (ICD-10-CM) - Vertebrogenic low back pain  THERAPY DIAG:  Other low back pain  Pain in left hip  Rationale for Evaluation and Treatment Rehabilitation  ONSET DATE:   SUBJECTIVE:   SUBJECTIVE STATEMENT: Patient presents today following insidious onset of L lumbar and hip pain, approx 8 months ago. 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 She has also had recurrent bil knee pain, L>R, that she feels affect her standing and walking on the L side. Pain has fluctuated up and down since onset, usually worse after longer days of travel. She also notes some swelling in the L LE from hip to knee approx 2 months ago, ultrasound was negative for DVT. Patient wants to decrease pain during longer bouts of travel, improve strength and fitness, and return to PLOF.  PERTINENT HISTORY: Knee OA, ADHD, GERD Bil knee arthroscopy  PAIN:  Are you having pain? Yes: NPRS scale: 2/10 Pain location: L lumbar, glute Pain description: dull, achey Aggravating factors: Prolonged sitting, walking Relieving factors: Ice, heat, stretching  PRECAUTIONS: None  WEIGHT BEARING RESTRICTIONS No  FALLS:  Has patient fallen in last 6 months? No  LIVING ENVIRONMENT: Lives with: lives with their family Lives in: House/apartment Stairs: Yes: Internal: 1  flight Has following equipment at home: None  OCCUPATION: Works for non-profit  PLOF: Independent  PATIENT GOALS Decrease pain, strengthen hip and knee   OBJECTIVE: 3  DIAGNOSTIC FINDINGS:   [place] for DVT: Negative   PATIENT SURVEYS:  FOTO 51 Expected - 68, 11 visits  COGNITION:  Overall cognitive status: Within functional limits for tasks assessed     SENSATION: WFL   POSTURE: rounded shoulders and increased lumbar lordosis  PALPATION: Pt is tender to palpation over L hip posterolateral musculature,  more irritable  LOWER EXTREMITY ROM:  Active ROM Right eval Left eval  Hip flexion    Hip extension    Hip abduction    Hip adduction    Hip internal rotation    Hip external rotation Limited Limited, painful  Knee flexion    Knee extension  +5  Ankle dorsiflexion    Ankle plantarflexion    Ankle inversion    Ankle eversion     (Blank rows = not tested)  LOWER EXTREMITY MMT:  MMT Right eval Left eval  Hip flexion 46.4 44.5  Hip extension    Hip abduction 64.1 67.9  Hip adduction    Hip internal rotation    Hip external rotation    Knee flexion    Knee extension 64.4` 48.0  Ankle dorsiflexion    Ankle plantarflexion    Ankle inversion    Ankle eversion     (Blank rows = not tested)  LOWER EXTREMITY SPECIAL TESTS:    - Straight leg raise: + at 60deg with reproduction of calf/posterior thigh pain  GAIT:   - Pt displays Trendelenburg gait pattern, L>R    TODAY'S TREATMENT: Eval  Stretching - Knee-to-chest, figure 4 stretch supine, 2x23min Quad set [employer] x15    PATIENT EDUCATION:  Education details: HEP, daily  Person educated: Patient Education method: Explanation Education comprehension: verbalized understanding   HOME EXERCISE PROGRAM:  Knee to chest stretch Figure 4 stretch in supine Quad set LAQ  ASSESSMENT:  CLINICAL IMPRESSION: Patient is a 41 y.o. F who was seen today for physical therapy evaluation and treatment for left hip and lumbar pain. Pt has symptoms consistent with mechanic low back pain - tenderness to palpation over lumbar and glute musculature and some discomfort with lumbar ROM. L knee range is a major contributing factor - lack of TKE ane extensor strength is greatly influencing pt ability to stand and walk with normalized posture. L hip and lumbar are likely overcompensating as a result. Pt will benefit from skilled therapy to address impairments, improve posture and stability with ADLs and occupational needs, and return  patient to full fitness and exercise routine.   OBJECTIVE IMPAIRMENTS Abnormal gait, decreased activity tolerance, decreased balance, decreased ROM, and decreased strength.   ACTIVITY LIMITATIONS sitting, standing, and stairs  PARTICIPATION LIMITATIONS: driving and occupation  PERSONAL FACTORS Fitness, Past/current experiences, and Time since onset of injury/illness/exacerbation are also affecting patient's functional outcome.   REHAB POTENTIAL: Good  CLINICAL DECISION MAKING: Evolving/moderate complexity  EVALUATION COMPLEXITY: Moderate   GOALS: Goals reviewed with patient? Yes  SHORT TERM GOALS: Target date: 12/07/2021 Patient will exhibit 0 degrees of knee extension actively to improve standing and gait mechanics. Baseline: Goal status: INITIAL  2.  Patient will demonstrate full lumbar extension without pain. Baseline:  Goal status: INITIAL  3.  Patient will improve bil hip abductor strength by 10% to reduce gait deviations. Baseline:  Goal status: INITIAL   LONG TERM GOALS: Target date:01/27/2022  Patient will be able to walk for 30 minutes without pain. Baseline:  Goal status: INITIAL  2.  Pt will be able to tolerate 30 minutes of sitting without increasing pain. Baseline:  Goal status: INITIAL  3.  Pt will be fully independent with exercise and gym routine to maintain current level of fitness. Baseline:  Goal status: INITIAL    PLAN: PT FREQUENCY: 1-2x/week  PT DURATION: 6 weeks  PLANNED INTERVENTIONS: Therapeutic exercises, Therapeutic activity, Neuromuscular re-education, Balance training, Gait training, Patient/Family education, Joint mobilization, Aquatic Therapy, Spinal mobilization, Cryotherapy, Moist heat, Ionotophoresis 4mg /ml Dexamethasone, and Manual therapy  PLAN FOR NEXT SESSION: Knee ROM and strength exercises, core strengthening, consider single leg stance   , PT 12/16/2021, 5:06 PM   Mathew Hamlet SPT 12/17/2021

## 2021-12-18 ENCOUNTER — Ambulatory Visit (HOSPITAL_BASED_OUTPATIENT_CLINIC_OR_DEPARTMENT_OTHER): Payer: 59 | Admitting: Physical Therapy

## 2021-12-18 ENCOUNTER — Encounter (HOSPITAL_BASED_OUTPATIENT_CLINIC_OR_DEPARTMENT_OTHER): Payer: Self-pay | Admitting: Physical Therapy

## 2021-12-18 DIAGNOSIS — M25552 Pain in left hip: Secondary | ICD-10-CM

## 2021-12-18 DIAGNOSIS — M5459 Other low back pain: Secondary | ICD-10-CM | POA: Diagnosis not present

## 2021-12-18 NOTE — Therapy (Signed)
OUTPATIENT PHYSICAL THERAPY LOWER EXTREMITY    Patient Name: Lynn Rodriguez MRN: [id number] DOB:01-May-1981, 41 y.o., female Today's Date: 12/16/2021   PT End of Session - 12/18/21 1120     Visit Number 2    Number of Visits 13    Date for PT Re-Evaluation 01/28/22    PT Start Time 1116    PT Stop Time 1200    PT Time Calculation (min) 44 min    Activity Tolerance Patient tolerated treatment well    Behavior During Therapy WFL for tasks assessed/performed             Past Medical History:  Diagnosis Date   ADHD (attention deficit hyperactivity disorder)    prn med.   Arthritis    knees   Asthma    prn inhaler   De Quervain's tenosynovitis, left 09/2013   Dental crown present    GERD (gastroesophageal reflux disease)    prn med.   Past Surgical History:  Procedure Laterality Date   CHOLECYSTECTOMY  08/23/2009   lap. chole.   DORSAL COMPARTMENT RELEASE Left 10/24/2013   Procedure: RELEASE DORSAL COMPARTMENT (DEQUERVAIN) LEFT WRIST;  Surgeon: Jodi Marble, MD;  Location: [facility];  Service: Orthopedics;  Laterality: Left;   KNEE ARTHROSCOPY Bilateral    TONSILLECTOMY AND ADENOIDECTOMY     Patient Active Problem List   Diagnosis Date Noted   Vitamin D deficiency 12/05/2010   NAUSEA AND VOMITING 03/08/2010   MYALGIA 01/30/2010   CONTACT DERMATITIS&OTHER ECZEMA DUE UNSPEC CAUSE 01/09/2010   CANDIDIASIS OF VULVA AND VAGINA 12/21/2009   URI 12/21/2009   PLANTAR FASCIITIS 10/25/2009   MUSCLE SPASM, TRAPEZIUS 10/25/2009   MUSCLE CRAMPS, FOOT 10/25/2009   ASTHMA NOS W/ACUTE EXACERBATION 08/29/2009   STREPTOCOCCAL SORE THROAT 07/24/2008   ABDOMINAL PAIN RIGHT LOWER QUADRANT 05/08/2008   ALLERGIC ASTHMA 08/19/2007   COUGH 08/19/2007   GERD 08/06/2007   PELVIC  PAIN 04/28/2007   DERMATOPHYTOSIS, FOOT 12/14/2006   Acute sinusitis, unspecified 12/14/2006   ALLERGIC RHINITIS 12/14/2006    PCP:  Dr Tracey Harries   REFERRING PROVIDER: Dr Venita Lick    REFERRING DIAG: M54.51 (ICD-10-CM) - Vertebrogenic low back pain  THERAPY DIAG:  Other low back pain  Pain in left hip  Rationale for Evaluation and Treatment Rehabilitation  ONSET DATE:   SUBJECTIVE:   SUBJECTIVE STATEMENT: Patient presents today following insidious onset of L lumbar and hip pain, approx 8 months ago. 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 She has also had recurrent bil knee pain, L>R, that she feels affect her standing and walking on the L side. Pain has fluctuated up and down since onset, usually worse after longer days of travel. She also notes some swelling in the L LE from hip to knee approx 2 months ago, ultrasound was negative for DVT. Patient wants to decrease pain during longer bouts of travel, improve strength and fitness, and return to PLOF.  PERTINENT HISTORY: Knee OA, ADHD, GERD Bil knee arthroscopy  PAIN:  Are you having pain? Yes: NPRS scale: 2/10 Pain location: L lumbar, glute Pain description: dull, achey Aggravating factors: Prolonged sitting, walking Relieving factors: Ice, heat, stretching  PRECAUTIONS: None  WEIGHT BEARING RESTRICTIONS No  FALLS:  Has patient fallen in last 6 months? No  LIVING ENVIRONMENT: Lives with: lives with their family Lives in: House/apartment Stairs: Yes: Internal: 1  flight Has following equipment at home: None  OCCUPATION: Works for non-profit  PLOF: Independent  PATIENT GOALS Decrease pain, strengthen hip and knee   OBJECTIVE: 3  DIAGNOSTIC FINDINGS:   [place] for DVT: Negative   PATIENT SURVEYS:  FOTO 51 Expected - 68, 11 visits  COGNITION:  Overall cognitive status: Within functional limits for tasks assessed     SENSATION: WFL   POSTURE: rounded shoulders and increased lumbar lordosis  PALPATION: Pt is tender to palpation over L hip posterolateral musculature, more  irritable  LOWER EXTREMITY ROM:  Active ROM Right eval Left eval  Hip flexion    Hip extension    Hip abduction    Hip adduction    Hip internal rotation    Hip external rotation Limited Limited, painful  Knee flexion    Knee extension  +5  Ankle dorsiflexion    Ankle plantarflexion    Ankle inversion    Ankle eversion     (Blank rows = not tested)  LOWER EXTREMITY MMT:  MMT Right eval Left eval  Hip flexion 46.4 44.5  Hip extension    Hip abduction 64.1 67.9  Hip adduction    Hip internal rotation    Hip external rotation    Knee flexion    Knee extension 64.4` 48.0  Ankle dorsiflexion    Ankle plantarflexion    Ankle inversion    Ankle eversion     (Blank rows = not tested)  LOWER EXTREMITY SPECIAL TESTS:    - Straight leg raise: + at 60deg with reproduction of calf/posterior thigh pain  GAIT:   - Pt displays Trendelenburg gait pattern, L>R    TODAY'S TREATMENT: Pt seen for aquatic therapy today.  Treatment took place in water 3.25-4.8 ft in depth at the [facility] pool. Temp of water was 91.  Pt entered/exited the pool via stairs (step through pattern) independently with bilat rail.  Intro to setting Walking ue unsupported Walking kick outs x6 widths Standing hamstring and gastroc stretch on step 3x30s hold Seated figure 4 for glut stretch 3x30s Lat step up 2x10 Step up (2nd step) x5 leading R/L forward -backward (1st step) R/L x10 Cues for eccentric as well as concentric control with all step ups, encourageing full knee extension/VMO with upright -straddling yellow noodle: cycling, skiing; add/abd in 4.8.  Minor modifications due to pt height.  May need to move to deeper pool in future  -hip hinges x 10 -lumbar rotation for rotation stretch x 10 -hip circles holding to wall CW and CCW 15ea  Pt requires buoyancy for support and to offload joints with strengthening exercises. Viscosity of the water is needed for resistance of  strengthening; water current perturbations provides challenge to standing balance unsupported, requiring increased core activation.    PATIENT EDUCATION:  Education details: HEP, daily  Person educated: Patient Education method: Explanation Education comprehension: verbalized understanding   HOME EXERCISE PROGRAM:  Knee to chest stretch Figure 4 stretch in supine Quad set LAQ  ASSESSMENT:  CLINICAL IMPRESSION: Pt safe and indep in pool.Pt edu on properties of water and benefits of aquatic therapy. Trialed pt in all depths and activities to gage tolerance. Pain 2/10 in left hip and LB upon initiation of treatment with reduction to 0/10.  No complaints of discomfort with any exercises.  Does report good stretches and being able to identify how weak her left knee is. Pt given VC and demonstration throughout for proper execution of exercises and mindfulness of target muscles being  used.  She is a good candidate for aquatic therapy to facilitate and hasten progression towards goals.  Patient is a 41 y.o. F who was seen today for physical therapy evaluation and treatment for left hip and lumbar pain. Pt has symptoms consistent with mechanic low back pain - tenderness to palpation over lumbar and glute musculature and some discomfort with lumbar ROM. L knee range is a major contributing factor - lack of TKE ane extensor strength is greatly influencing pt ability to stand and walk with normalized posture. L hip and lumbar are likely overcompensating as a result. Pt will benefit from skilled therapy to address impairments, improve posture and stability with ADLs and occupational needs, and return patient to full fitness and exercise routine.   OBJECTIVE IMPAIRMENTS Abnormal gait, decreased activity tolerance, decreased balance, decreased ROM, and decreased strength.   ACTIVITY LIMITATIONS sitting, standing, and stairs  PARTICIPATION LIMITATIONS: driving and occupation  PERSONAL FACTORS  Fitness, Past/current experiences, and Time since onset of injury/illness/exacerbation are also affecting patient's functional outcome.   REHAB POTENTIAL: Good  CLINICAL DECISION MAKING: Evolving/moderate complexity  EVALUATION COMPLEXITY: Moderate   GOALS: Goals reviewed with patient? Yes  SHORT TERM GOALS: Target date: 12/07/2021 Patient will exhibit 0 degrees of knee extension actively to improve standing and gait mechanics. Baseline: Goal status: INITIAL  2.  Patient will demonstrate full lumbar extension without pain. Baseline:  Goal status: INITIAL  3.  Patient will improve bil hip abductor strength by 10% to reduce gait deviations. Baseline:  Goal status: INITIAL   LONG TERM GOALS: Target date:01/27/2022  Patient will be able to walk for 30 minutes without pain. Baseline:  Goal status: INITIAL  2.  Pt will be able to tolerate 30 minutes of sitting without increasing pain. Baseline:  Goal status: INITIAL  3.  Pt will be fully independent with exercise and gym routine to maintain current level of fitness. Baseline:  Goal status: INITIAL    PLAN: PT FREQUENCY: 1-2x/week  PT DURATION: 6 weeks  PLANNED INTERVENTIONS: Therapeutic exercises, Therapeutic activity, Neuromuscular re-education, Balance training, Gait training, Patient/Family education, Joint mobilization, Aquatic Therapy, Spinal mobilization, Cryotherapy, Moist heat, Ionotophoresis 4mg /ml Dexamethasone, and Manual therapy  PLAN FOR NEXT SESSION: Toleration to 1st session; Knee ROM and strength exercises, core strengthening, consider single leg stance   Cassady Stanczak (Frankie) Manjot Beumer MPT 12/16/2021, 11:22 AM

## 2021-12-23 ENCOUNTER — Ambulatory Visit (HOSPITAL_BASED_OUTPATIENT_CLINIC_OR_DEPARTMENT_OTHER): Payer: 59 | Admitting: Physical Therapy

## 2021-12-23 ENCOUNTER — Encounter (HOSPITAL_BASED_OUTPATIENT_CLINIC_OR_DEPARTMENT_OTHER): Payer: Self-pay | Admitting: Physical Therapy

## 2021-12-23 DIAGNOSIS — M25552 Pain in left hip: Secondary | ICD-10-CM

## 2021-12-23 DIAGNOSIS — M5459 Other low back pain: Secondary | ICD-10-CM | POA: Diagnosis not present

## 2021-12-26 ENCOUNTER — Ambulatory Visit (HOSPITAL_BASED_OUTPATIENT_CLINIC_OR_DEPARTMENT_OTHER): Payer: 59 | Admitting: Physical Therapy

## 2021-12-26 DIAGNOSIS — M5459 Other low back pain: Secondary | ICD-10-CM | POA: Diagnosis not present

## 2021-12-26 DIAGNOSIS — M25552 Pain in left hip: Secondary | ICD-10-CM

## 2021-12-26 NOTE — Therapy (Signed)
OUTPATIENT PHYSICAL THERAPY LOWER EXTREMITY    Patient Name: Lynn Rodriguez MRN: [id number] DOB:11/15/1980, 41 y.o., female Today's Date: 12/16/2021   PT End of Session - 12/26/21 0806     Visit Number 4    Number of Visits 13    Date for PT Re-Evaluation 01/28/22    PT Start Time 0758    PT Stop Time 0841    PT Time Calculation (min) 43 min    Activity Tolerance Patient tolerated treatment well    Behavior During Therapy [facility] for tasks assessed/performed             Past Medical History:  Diagnosis Date   ADHD (attention deficit hyperactivity disorder)    prn med.   Arthritis    knees   Asthma    prn inhaler   De Quervain's tenosynovitis, left 09/2013   Dental crown present    GERD (gastroesophageal reflux disease)    prn med.   Past Surgical History:  Procedure Laterality Date   CHOLECYSTECTOMY  08/23/2009   lap. chole.   DORSAL COMPARTMENT RELEASE Left 10/24/2013   Procedure: RELEASE DORSAL COMPARTMENT (DEQUERVAIN) LEFT WRIST;  Surgeon: Jodi Marble, MD;  Location: [facility];  Service: Orthopedics;  Laterality: Left;   KNEE ARTHROSCOPY Bilateral    TONSILLECTOMY AND ADENOIDECTOMY     Patient Active Problem List   Diagnosis Date Noted   Vitamin D deficiency 12/05/2010   NAUSEA AND VOMITING 03/08/2010   MYALGIA 01/30/2010   CONTACT DERMATITIS&OTHER ECZEMA DUE UNSPEC CAUSE 01/09/2010   CANDIDIASIS OF VULVA AND VAGINA 12/21/2009   URI 12/21/2009   PLANTAR FASCIITIS 10/25/2009   MUSCLE SPASM, TRAPEZIUS 10/25/2009   MUSCLE CRAMPS, FOOT 10/25/2009   ASTHMA NOS W/ACUTE EXACERBATION 08/29/2009   STREPTOCOCCAL SORE THROAT 07/24/2008   ABDOMINAL PAIN RIGHT LOWER QUADRANT 05/08/2008   ALLERGIC ASTHMA 08/19/2007   COUGH 08/19/2007   GERD 08/06/2007   PELVIC  PAIN 04/28/2007   DERMATOPHYTOSIS, FOOT 12/14/2006   Acute sinusitis, unspecified 12/14/2006   ALLERGIC RHINITIS 12/14/2006    PCP:  Dr Tracey Harries   REFERRING PROVIDER: Dr Venita Lick    REFERRING DIAG: M54.51 (ICD-10-CM) - Vertebrogenic low back pain  THERAPY DIAG:  Other low back pain  Pain in left hip  Rationale for Evaluation and Treatment Rehabilitation  ONSET DATE:   SUBJECTIVE:   SUBJECTIVE STATEMENT: Pt reports that she was sore yesterday in back and side of Lt hip.  Stretching helped.  Soreness is less today.   PERTINENT HISTORY: Knee OA, ADHD, GERD Bil knee arthroscopy  PAIN:  Are you having pain? Yes: NPRS scale: 2/10 Pain location: L ant/lat hip Pain description: dull, achey Aggravating factors: Prolonged sitting, walking Relieving factors: Ice, heat, stretching  PRECAUTIONS: None  WEIGHT BEARING RESTRICTIONS No  FALLS:  Has patient fallen in last 6 months? No  LIVING ENVIRONMENT: Lives with: lives with their family Lives in: House/apartment Stairs: Yes: Internal: 1 flight Has following equipment at home: None  OCCUPATION: Works for non-profit  PLOF: Independent  PATIENT GOALS Decrease pain, strengthen hip and knee   OBJECTIVE: 3  DIAGNOSTIC FINDINGS:   [place] for DVT: Negative   PATIENT SURVEYS:  FOTO 51 Expected - 68, 11 visits  COGNITION:  Overall cognitive status: Within functional limits for tasks assessed     SENSATION: WFL   POSTURE: rounded shoulders and increased lumbar lordosis  PALPATION: Pt is tender to palpation over L hip posterolateral musculature, more irritable  LOWER EXTREMITY ROM:  Active ROM Right eval Left eval  Hip flexion    Hip extension    Hip abduction    Hip adduction    Hip internal rotation    Hip external rotation Limited Limited, painful  Knee flexion    Knee extension  +5  Ankle dorsiflexion    Ankle plantarflexion    Ankle inversion    Ankle eversion     (Blank rows = not tested)  LOWER EXTREMITY MMT:  MMT Right eval Left eval  Hip flexion 46.4 44.5  Hip extension    Hip abduction 64.1 67.9  Hip adduction    Hip internal rotation    Hip external rotation     Knee flexion    Knee extension 64.4` 48.0  Ankle dorsiflexion    Ankle plantarflexion    Ankle inversion    Ankle eversion     (Blank rows = not tested)  LOWER EXTREMITY SPECIAL TESTS:    - Straight leg raise: + at 60deg with reproduction of calf/posterior thigh pain  GAIT:   - Pt displays Trendelenburg gait pattern, L>R    TODAY'S TREATMENT: Pt seen for aquatic therapy today.  Treatment took place in water 3.25-4.8 ft in depth at the [facility] pool. Temp of water was 91.  Pt entered/exited the pool via stairs (step through pattern) independently with bilat rail.  Warm up of walking forward, backward and side stepping Walking forward kick outs x 6 widths Warrior 1, 2, 3 with noodle x 10 each  Holding yellow noodle:  curtsy lunge followed by knee up and opp arm lift x 10 each side Suspended by yellow noodle - bilat clams x 5 (pain in L ant hip) Adductor stretch holding noodle > wall  Repeated single clams (suspended by noodle) -x 10, no pain Side squat with arm abdct/ add with yellow hand buoys With yellow hand buoys under water: walking forward / backward lunges Forward R step down/ retro step up L on 1st step Rt lateral heel taps on 1st step for L quad strengthening Quad stretch with ankle supported by yellow noodle x 3 reps each LE   Pt requires buoyancy for support and to offload joints with strengthening exercises. Viscosity of the water is needed for resistance of strengthening; water current perturbations provides challenge to standing balance unsupported, requiring increased core activation.    PATIENT EDUCATION:  Education details: HEP, daily  Person educated: Patient Education method: Explanation Education comprehension: verbalized understanding   HOME EXERCISE PROGRAM:  Access Code: [email or web address] URL: [email or web address] Date: 12/26/2021 Prepared by: [facility] - Outpatient Rehab - [address]  Exercises - Clam  - 1 x daily - 3 x  weekly - 2 sets - 10 reps  ( told about reverse to tap knee and reg to tap foot behind) - Supine Active Straight Leg Raise  - 1 x daily - 3 x weekly - 2 sets - 10 reps - Staggered Stance Squat  - 1 x daily - 7 x weekly - 1-2 sets - 3 reps - Hooklying Single Knee to Chest Stretch  - 1 x daily - 7 x weekly - 1 sets - 2 reps - 10-20 seconds  hold - Supine Piriformis Stretch with Foot on Ground  - 1-2 x daily - 7 x weekly - 1 sets - 2 reps - 10-20 seconds hold - Supine Hip Internal and External Rotation  - 1 x daily - 7 x weekly - 1 sets - 2 reps - 10-20 seconds hold -  Seated Table Piriformis Stretch  - 1 x daily - 7 x weekly - 1 sets - 2 reps - 10-20 seconds hold - Lateral Lunge Adductor Stretch with Counter Support  - 1 x daily - 7 x weekly - 1 sets - 2 reps - 10-20 seconds  hold  ASSESSMENT:  CLINICAL IMPRESSION: Pt reported increase in Lt hip pain with bilat clams in water; eliminated with standing adductor stretch.  She tolerated all other exercises well, reporting elimination of L hip pain during session.  Recommended she try L SLR and staggered stance squats (L foot back) for possibility to add to HEP. Still remains weaker in Lt quad compared to Rt.  Issued HEP today.  Pt will benefit from skilled therapy to address impairments, improve posture and stability with ADLs and occupational needs, and return patient to full fitness and exercise routine.   OBJECTIVE IMPAIRMENTS Abnormal gait, decreased activity tolerance, decreased balance, decreased ROM, and decreased strength.   ACTIVITY LIMITATIONS sitting, standing, and stairs  PARTICIPATION LIMITATIONS: driving and occupation  PERSONAL FACTORS Fitness, Past/current experiences, and Time since onset of injury/illness/exacerbation are also affecting patient's functional outcome.   REHAB POTENTIAL: Good  CLINICAL DECISION MAKING: Evolving/moderate complexity  EVALUATION COMPLEXITY: Moderate   GOALS: Goals reviewed with patient?  Yes  SHORT TERM GOALS: Target date: 12/07/2021 Patient will exhibit 0 degrees of knee extension actively to improve standing and gait mechanics. Baseline: Goal status: INITIAL  2.  Patient will demonstrate full lumbar extension without pain. Baseline:  Goal status: INITIAL  3.  Patient will improve bil hip abductor strength by 10% to reduce gait deviations. Baseline:  Goal status: INITIAL   LONG TERM GOALS: Target date:01/27/2022  Patient will be able to walk for 30 minutes without pain. Baseline:  Goal status: INITIAL  2.  Pt will be able to tolerate 30 minutes of sitting without increasing pain. Baseline:  Goal status: INITIAL  3.  Pt will be fully independent with exercise and gym routine to maintain current level of fitness. Baseline:  Goal status: INITIAL    PLAN: PT FREQUENCY: 1-2x/week  PT DURATION: 6 weeks  PLANNED INTERVENTIONS: Therapeutic exercises, Therapeutic activity, Neuromuscular re-education, Balance training, Gait training, Patient/Family education, Joint mobilization, Aquatic Therapy, Spinal mobilization, Cryotherapy, Moist heat, Ionotophoresis 4mg /ml Dexamethasone, and Manual therapy  PLAN FOR NEXT SESSION:   Lt hip/knee strengthening / stretching in aquatic setting.  Assess response to HEP.  Add Lt SLS and hip flexor stretch.  , PTA 12/26/21 8:57 AM

## 2021-12-30 ENCOUNTER — Ambulatory Visit (HOSPITAL_BASED_OUTPATIENT_CLINIC_OR_DEPARTMENT_OTHER): Payer: 59 | Attending: Orthopedic Surgery | Admitting: Physical Therapy

## 2021-12-30 ENCOUNTER — Encounter (HOSPITAL_BASED_OUTPATIENT_CLINIC_OR_DEPARTMENT_OTHER): Payer: Self-pay | Admitting: Physical Therapy

## 2021-12-30 DIAGNOSIS — M5459 Other low back pain: Secondary | ICD-10-CM | POA: Insufficient documentation

## 2021-12-30 DIAGNOSIS — M25552 Pain in left hip: Secondary | ICD-10-CM | POA: Insufficient documentation

## 2021-12-30 NOTE — Therapy (Signed)
OUTPATIENT PHYSICAL THERAPY LOWER EXTREMITY    Patient Name: Lynn Rodriguez MRN: [id number] DOB:1980/11/12, 41 y.o., female Today's Date: 12/16/2021   PT End of Session - 12/30/21 1503     Visit Number 5    Number of Visits 13    Date for PT Re-Evaluation 01/28/22    PT Start Time 1501    PT Stop Time 1545    PT Time Calculation (min) 44 min    Activity Tolerance Patient tolerated treatment well    Behavior During Therapy [facility] for tasks assessed/performed             Past Medical History:  Diagnosis Date   ADHD (attention deficit hyperactivity disorder)    prn med.   Arthritis    knees   Asthma    prn inhaler   De Quervain's tenosynovitis, left 09/2013   Dental crown present    GERD (gastroesophageal reflux disease)    prn med.   Past Surgical History:  Procedure Laterality Date   CHOLECYSTECTOMY  08/23/2009   lap. chole.   DORSAL COMPARTMENT RELEASE Left 10/24/2013   Procedure: RELEASE DORSAL COMPARTMENT (DEQUERVAIN) LEFT WRIST;  Surgeon: Jodi Marble, MD;  Location: [facility];  Service: Orthopedics;  Laterality: Left;   KNEE ARTHROSCOPY Bilateral    TONSILLECTOMY AND ADENOIDECTOMY     Patient Active Problem List   Diagnosis Date Noted   Vitamin D deficiency 12/05/2010   NAUSEA AND VOMITING 03/08/2010   MYALGIA 01/30/2010   CONTACT DERMATITIS&OTHER ECZEMA DUE UNSPEC CAUSE 01/09/2010   CANDIDIASIS OF VULVA AND VAGINA 12/21/2009   URI 12/21/2009   PLANTAR FASCIITIS 10/25/2009   MUSCLE SPASM, TRAPEZIUS 10/25/2009   MUSCLE CRAMPS, FOOT 10/25/2009   ASTHMA NOS W/ACUTE EXACERBATION 08/29/2009   STREPTOCOCCAL SORE THROAT 07/24/2008   ABDOMINAL PAIN RIGHT LOWER QUADRANT 05/08/2008   ALLERGIC ASTHMA 08/19/2007   COUGH 08/19/2007   GERD 08/06/2007   PELVIC  PAIN 04/28/2007   DERMATOPHYTOSIS, FOOT 12/14/2006   Acute sinusitis, unspecified 12/14/2006   ALLERGIC RHINITIS 12/14/2006    PCP:  Dr Tracey Harries   REFERRING PROVIDER: Dr Venita Lick    REFERRING DIAG: M54.51 (ICD-10-CM) - Vertebrogenic low back pain  THERAPY DIAG:  Other low back pain  Pain in left hip  Rationale for Evaluation and Treatment Rehabilitation  ONSET DATE:   SUBJECTIVE:   SUBJECTIVE STATEMENT: "I have learned to slow down when I stretch and am feeling better"  Mostly compliant with HEP"  PERTINENT HISTORY: Knee OA, ADHD, GERD Bil knee arthroscopy  PAIN:  Are you having pain? Yes: NPRS scale: 1/10 Pain location: L ant/lat hip Pain description: dull, achey Aggravating factors: Prolonged sitting, walking Relieving factors: Ice, heat, stretching  PRECAUTIONS: None  WEIGHT BEARING RESTRICTIONS No  FALLS:  Has patient fallen in last 6 months? No  LIVING ENVIRONMENT: Lives with: lives with their family Lives in: House/apartment Stairs: Yes: Internal: 1 flight Has following equipment at home: None  OCCUPATION: Works for non-profit  PLOF: Independent  PATIENT GOALS Decrease pain, strengthen hip and knee   OBJECTIVE: 3  DIAGNOSTIC FINDINGS:   [place] for DVT: Negative   PATIENT SURVEYS:  FOTO 51 Expected - 68, 11 visits  COGNITION:  Overall cognitive status: Within functional limits for tasks assessed     SENSATION: WFL   POSTURE: rounded shoulders and increased lumbar lordosis  PALPATION: Pt is tender to palpation over L hip posterolateral musculature, more irritable  LOWER EXTREMITY ROM:  Active ROM Right eval Left  eval  Hip flexion    Hip extension    Hip abduction    Hip adduction    Hip internal rotation    Hip external rotation Limited Limited, painful  Knee flexion    Knee extension  +5  Ankle dorsiflexion    Ankle plantarflexion    Ankle inversion    Ankle eversion     (Blank rows = not tested)  LOWER EXTREMITY MMT:  MMT Right eval Left eval  Hip flexion 46.4 44.5  Hip extension    Hip abduction 64.1 67.9  Hip adduction    Hip internal rotation    Hip external rotation    Knee flexion     Knee extension 64.4` 48.0  Ankle dorsiflexion    Ankle plantarflexion    Ankle inversion    Ankle eversion     (Blank rows = not tested)  LOWER EXTREMITY SPECIAL TESTS:    - Straight leg raise: + at 60deg with reproduction of calf/posterior thigh pain  GAIT:   - Pt displays Trendelenburg gait pattern, L>R    TODAY'S TREATMENT: Pt seen for aquatic therapy today.  Treatment took place in water 3.25-4.8 ft in depth at the [facility] pool. Temp of water was 91.  Pt entered/exited the pool via stairs (step through pattern) independently with bilat rail.  Warm up of walking forward, backward and side stepping Quad stretch with ankle supported by yellow noodle x 3 reps each LE; hip flex stretch leaning forward 3x20s R/L Adductor stretch holding noodle > wall  Suspended by yellow noodle - bilat clams x 10 no pain "hurt so good Repeated single clams x 10, no pain Walking forward kick outs x 6 widths Side squat with arm abdct/ add with yellow hand buoys With yellow hand buoys under water: walking forward / backward lunges Lateral step up towards left x 10 SL squat LLE on bottom step 2x 10 Warrior 1, 2, 3 with noodle x 10 each  SLS R/L x>20s with vision.  VE multiple tries with submersion to waist. Best x10 s    Pt requires buoyancy for support and to offload joints with strengthening exercises. Viscosity of the water is needed for resistance of strengthening; water current perturbations provides challenge to standing balance unsupported, requiring increased core activation.    PATIENT EDUCATION:  Education details: HEP, daily  Person educated: Patient Education method: Explanation Education comprehension: verbalized understanding   HOME EXERCISE PROGRAM:  Access Code: [email or web address] URL: [email or web address] Date: 12/26/2021 Prepared by: [facility] - Outpatient Rehab - [address]  Exercises - Clam  - 1 x daily - 3 x weekly - 2 sets - 10 reps  ( told  about reverse to tap knee and reg to tap foot behind) - Supine Active Straight Leg Raise  - 1 x daily - 3 x weekly - 2 sets - 10 reps - Staggered Stance Squat  - 1 x daily - 7 x weekly - 1-2 sets - 3 reps - Hooklying Single Knee to Chest Stretch  - 1 x daily - 7 x weekly - 1 sets - 2 reps - 10-20 seconds  hold - Supine Piriformis Stretch with Foot on Ground  - 1-2 x daily - 7 x weekly - 1 sets - 2 reps - 10-20 seconds hold - Supine Hip Internal and External Rotation  - 1 x daily - 7 x weekly - 1 sets - 2 reps - 10-20 seconds hold - Seated Table Piriformis Stretch  - 1 x daily -  7 x weekly - 1 sets - 2 reps - 10-20 seconds hold - Lateral Lunge Adductor Stretch with Counter Support  - 1 x daily - 7 x weekly - 1 sets - 2 reps - 10-20 seconds  hold  ASSESSMENT:  CLINICAL IMPRESSION: Pain is low "I was surprised this morning".  Good response to last session.  Overall pain is decreasing.  Tolerates all ex without discomfort except backward lunge. Focus on stretching and strengthening (particular attention to left quad strength).Added SLS which pt completes well.  Increased challenge eliminating vision.  Goals ongoing    OBJECTIVE IMPAIRMENTS Abnormal gait, decreased activity tolerance, decreased balance, decreased ROM, and decreased strength.   ACTIVITY LIMITATIONS sitting, standing, and stairs  PARTICIPATION LIMITATIONS: driving and occupation  PERSONAL FACTORS Fitness, Past/current experiences, and Time since onset of injury/illness/exacerbation are also affecting patient's functional outcome.   REHAB POTENTIAL: Good  CLINICAL DECISION MAKING: Evolving/moderate complexity  EVALUATION COMPLEXITY: Moderate   GOALS: Goals reviewed with patient? Yes  SHORT TERM GOALS: Target date: 12/07/2021 Patient will exhibit 0 degrees of knee extension actively to improve standing and gait mechanics. Baseline: Goal status: INITIAL  2.  Patient will demonstrate full lumbar extension without  pain. Baseline:  Goal status: INITIAL  3.  Patient will improve bil hip abductor strength by 10% to reduce gait deviations. Baseline:  Goal status: INITIAL   LONG TERM GOALS: Target date:01/27/2022  Patient will be able to walk for 30 minutes without pain. Baseline:  Goal status: INITIAL  2.  Pt will be able to tolerate 30 minutes of sitting without increasing pain. Baseline:  Goal status: INITIAL  3.  Pt will be fully independent with exercise and gym routine to maintain current level of fitness. Baseline:  Goal status: INITIAL    PLAN: PT FREQUENCY: 1-2x/week  PT DURATION: 6 weeks  PLANNED INTERVENTIONS: Therapeutic exercises, Therapeutic activity, Neuromuscular re-education, Balance training, Gait training, Patient/Family education, Joint mobilization, Aquatic Therapy, Spinal mobilization, Cryotherapy, Moist heat, Ionotophoresis 4mg /ml Dexamethasone, and Manual therapy  PLAN FOR NEXT SESSION:   Lt hip/knee strengthening / stretching in aquatic setting.  Assess response to HEP.  Add Lt SLS and hip flexor stretch.  Corrie Dandy) Trenisha Lafavor MPT 12/30/21 3:04 PM

## 2022-01-07 ENCOUNTER — Ambulatory Visit (HOSPITAL_BASED_OUTPATIENT_CLINIC_OR_DEPARTMENT_OTHER): Payer: Self-pay | Admitting: Physical Therapy

## 2022-01-09 ENCOUNTER — Encounter (HOSPITAL_BASED_OUTPATIENT_CLINIC_OR_DEPARTMENT_OTHER): Payer: Self-pay | Admitting: Physical Therapy

## 2022-01-09 ENCOUNTER — Ambulatory Visit (HOSPITAL_BASED_OUTPATIENT_CLINIC_OR_DEPARTMENT_OTHER): Payer: 59 | Admitting: Physical Therapy

## 2022-01-09 DIAGNOSIS — M5459 Other low back pain: Secondary | ICD-10-CM

## 2022-01-09 DIAGNOSIS — M25552 Pain in left hip: Secondary | ICD-10-CM | POA: Diagnosis present

## 2022-01-09 NOTE — Therapy (Signed)
OUTPATIENT PHYSICAL THERAPY LOWER EXTREMITY TREATMENT   Patient Name: Lynn Rodriguez MRN: [id number] DOB:1981/04/16, 41 y.o., female Today's Date: 12/16/2021   PT End of Session - 01/09/22 1211     Visit Number 6    Number of Visits 13    Date for PT Re-Evaluation 01/28/22    PT Start Time 1202    PT Stop Time 1245    PT Time Calculation (min) 43 min    Activity Tolerance Patient tolerated treatment well    Behavior During Therapy [facility] for tasks assessed/performed             Past Medical History:  Diagnosis Date   ADHD (attention deficit hyperactivity disorder)    prn med.   Arthritis    knees   Asthma    prn inhaler   De Quervain's tenosynovitis, left 09/2013   Dental crown present    GERD (gastroesophageal reflux disease)    prn med.   Past Surgical History:  Procedure Laterality Date   CHOLECYSTECTOMY  08/23/2009   lap. chole.   DORSAL COMPARTMENT RELEASE Left 10/24/2013   Procedure: RELEASE DORSAL COMPARTMENT (DEQUERVAIN) LEFT WRIST;  Surgeon: Jodi Marble, MD;  Location: [facility];  Service: Orthopedics;  Laterality: Left;   KNEE ARTHROSCOPY Bilateral    TONSILLECTOMY AND ADENOIDECTOMY     Patient Active Problem List   Diagnosis Date Noted   Vitamin D deficiency 12/05/2010   NAUSEA AND VOMITING 03/08/2010   MYALGIA 01/30/2010   CONTACT DERMATITIS&OTHER ECZEMA DUE UNSPEC CAUSE 01/09/2010   CANDIDIASIS OF VULVA AND VAGINA 12/21/2009   URI 12/21/2009   PLANTAR FASCIITIS 10/25/2009   MUSCLE SPASM, TRAPEZIUS 10/25/2009   MUSCLE CRAMPS, FOOT 10/25/2009   ASTHMA NOS W/ACUTE EXACERBATION 08/29/2009   STREPTOCOCCAL SORE THROAT 07/24/2008   ABDOMINAL PAIN RIGHT LOWER QUADRANT 05/08/2008   ALLERGIC ASTHMA 08/19/2007   COUGH 08/19/2007   GERD 08/06/2007   PELVIC  PAIN 04/28/2007   DERMATOPHYTOSIS, FOOT 12/14/2006   Acute sinusitis, unspecified 12/14/2006   ALLERGIC RHINITIS 12/14/2006    PCP:  Dr Tracey Harries   REFERRING PROVIDER: Dr  Venita Lick   REFERRING DIAG: M54.51 (ICD-10-CM) - Vertebrogenic low back pain  THERAPY DIAG:  Other low back pain  Pain in left hip  Rationale for Evaluation and Treatment Rehabilitation  ONSET DATE:   SUBJECTIVE:   SUBJECTIVE STATEMENT: Pt reports that she traveled to [place] since last visit; had a lot of delays and had to sit for long periods.  She reports she woke up with increased tightness and pain in Lt hip (6/10).  She leaves for trip to [place] on Saturday.  She will be away from therapy for 3 wks.   PERTINENT HISTORY: Knee OA, ADHD, GERD Bil knee arthroscopy  PAIN:  Are you having pain? Yes: NPRS scale: 4/10 Pain location: L ant/lat hip Pain description: dull, achey Aggravating factors: Prolonged sitting, walking Relieving factors: Ice, heat, stretching  PRECAUTIONS: None  WEIGHT BEARING RESTRICTIONS No  FALLS:  Has patient fallen in last 6 months? No  LIVING ENVIRONMENT: Lives with: lives with their family Lives in: House/apartment Stairs: Yes: Internal: 1 flight Has following equipment at home: None  OCCUPATION: Works for non-profit  PLOF: Independent  PATIENT GOALS Decrease pain, strengthen hip and knee   OBJECTIVE: 3  DIAGNOSTIC FINDINGS:   [place] for DVT: Negative   PATIENT SURVEYS:  FOTO 51 Expected - 68, 11 visits  COGNITION:  Overall cognitive status: Within functional limits for tasks assessed  SENSATION: WFL   POSTURE: rounded shoulders and increased lumbar lordosis  PALPATION: Pt is tender to palpation over L hip posterolateral musculature, more irritable  LOWER EXTREMITY ROM:  Active ROM Right eval Left eval  Hip flexion    Hip extension    Hip abduction    Hip adduction    Hip internal rotation    Hip external rotation Limited Limited, painful  Knee flexion    Knee extension  +5  Ankle dorsiflexion    Ankle plantarflexion    Ankle inversion    Ankle eversion     (Blank rows = not tested)  LOWER  EXTREMITY MMT:  MMT Right eval Left eval  Hip flexion 46.4 44.5  Hip extension    Hip abduction 64.1 67.9  Hip adduction    Hip internal rotation    Hip external rotation    Knee flexion    Knee extension 64.4` 48.0  Ankle dorsiflexion    Ankle plantarflexion    Ankle inversion    Ankle eversion     (Blank rows = not tested)  LOWER EXTREMITY SPECIAL TESTS:    - Straight leg raise: + at 60deg with reproduction of calf/posterior thigh pain  GAIT:   - Pt displays Trendelenburg gait pattern, L>R    TODAY'S TREATMENT: Pt seen for aquatic therapy today.  Treatment took place in water 3.25-4.8 ft in depth at the [facility] pool. Temp of water was 91.  Pt entered/exited the pool via stairs (step through pattern) independently with bilat rail.  Warm up of walking forward and backward lunges and side stepping Curtsy lunge holding yellow noodle, then with added hip opener Forward walking hurdles with coordinated arms and core engaged Holding yellow noodle: hip circles (knee straight) CW/CCW Walking forward kick outs x 3 laps Suspended by yellow noodle - bilat clams x 10 no pain  Repeated single clams in stork stance x 10, no pain Side deep squat with arm abdct/ add with yellow hand buoys Forward walking lunge holding yellow hand buoys at side Leg press in hip flexion/ hip abdct x 10 each leg pressing yellow noodle to floor in water Quad stretch with ankle supported by yellow noodle x 3 reps each LE; hip flex stretch leaning forward 3x20s R/L Adductor stretch holding wall  Return to walking for rest and recovery  Pt requires buoyancy for support and to offload joints with strengthening exercises. Viscosity of the water is needed for resistance of strengthening; water current perturbations provides challenge to standing balance unsupported, requiring increased core activation.    PATIENT EDUCATION:  Education details: reviewed possible aquatic exercises to do while  away.  Person educated: Patient Education method: Explanation Education comprehension: verbalized understanding   HOME EXERCISE PROGRAM:  Access Code: [email or web address] URL: [email or web address] Date: 12/26/2021 Prepared by: [facility] - [address]  Exercises - Clam  - 1 x daily - 3 x weekly - 2 sets - 10 reps  ( told about reverse to tap knee and reg to tap foot behind) - Supine Active Straight Leg Raise  - 1 x daily - 3 x weekly - 2 sets - 10 reps - Staggered Stance Squat  - 1 x daily - 7 x weekly - 1-2 sets - 3 reps - Hooklying Single Knee to Chest Stretch  - 1 x daily - 7 x weekly - 1 sets - 2 reps - 10-20 seconds  hold - Supine Piriformis Stretch with Foot on Ground  - 1-2 x daily -  7 x weekly - 1 sets - 2 reps - 10-20 seconds hold - Supine Hip Internal and External Rotation  - 1 x daily - 7 x weekly - 1 sets - 2 reps - 10-20 seconds hold - Seated Table Piriformis Stretch  - 1 x daily - 7 x weekly - 1 sets - 2 reps - 10-20 seconds hold - Lateral Lunge Adductor Stretch with Counter Support  - 1 x daily - 7 x weekly - 1 sets - 2 reps - 10-20 seconds  hold  ASSESSMENT:  CLINICAL IMPRESSION:  Pt reported reduction of tightness in Lt hip with aquatic exercises; reduction of pain at end of session to 2/10.  She is reporting overall improvement in symptoms since starting therapy.  Pt will be away on business trip out of country; will need reassessment upon her return.    OBJECTIVE IMPAIRMENTS Abnormal gait, decreased activity tolerance, decreased balance, decreased ROM, and decreased strength.   ACTIVITY LIMITATIONS sitting, standing, and stairs  PARTICIPATION LIMITATIONS: driving and occupation  [place], Past/current experiences, and Time since onset of injury/illness/exacerbation are also affecting patient's functional outcome.   REHAB POTENTIAL: Good  CLINICAL DECISION MAKING: Evolving/moderate complexity  EVALUATION COMPLEXITY:  Moderate   GOALS: Goals reviewed with patient? Yes  SHORT TERM GOALS: Target date: 12/07/2021 Patient will exhibit 0 degrees of knee extension actively to improve standing and gait mechanics. Baseline: Goal status: Ongoing  2.  Patient will demonstrate full lumbar extension without pain. Baseline:  Goal status: Ongoing   3.  Patient will improve bil hip abductor strength by 10% to reduce gait deviations. Baseline:  Goal status:Ongoing   LONG TERM GOALS: Target date:01/27/2022  Patient will be able to walk for 30 minutes without pain. Baseline:  Goal status: INITIAL  2.  Pt will be able to tolerate 30 minutes of sitting without increasing pain. Baseline:  Goal status: INITIAL  3.  Pt will be fully independent with exercise and gym routine to maintain current level of fitness. Baseline:  Goal status: INITIAL    PLAN: PT FREQUENCY: 1-2x/week  PT DURATION: 6 weeks  PLANNED INTERVENTIONS: Therapeutic exercises, Therapeutic activity, Neuromuscular re-education, Balance training, Gait training, Patient/Family education, Joint mobilization, Aquatic Therapy, Spinal mobilization, Cryotherapy, Moist heat, Ionotophoresis 4mg /ml Dexamethasone, and Manual therapy  PLAN FOR NEXT SESSION:   Assess STG/LTG, FOTO for recert.   Kerin Perna, PTA 01/09/22 1:28 PM

## 2022-01-24 ENCOUNTER — Encounter (HOSPITAL_BASED_OUTPATIENT_CLINIC_OR_DEPARTMENT_OTHER): Payer: Self-pay

## 2022-02-03 ENCOUNTER — Encounter (HOSPITAL_BASED_OUTPATIENT_CLINIC_OR_DEPARTMENT_OTHER): Payer: Self-pay | Admitting: Physical Therapy

## 2022-02-03 ENCOUNTER — Ambulatory Visit (HOSPITAL_BASED_OUTPATIENT_CLINIC_OR_DEPARTMENT_OTHER): Payer: 59 | Attending: Orthopedic Surgery | Admitting: Physical Therapy

## 2022-02-03 DIAGNOSIS — M25552 Pain in left hip: Secondary | ICD-10-CM | POA: Diagnosis present

## 2022-02-03 DIAGNOSIS — M5459 Other low back pain: Secondary | ICD-10-CM | POA: Diagnosis present

## 2022-02-03 NOTE — Therapy (Signed)
PHYSICAL THERAPY DISCHARGE SUMMARY  Visits from Start of Care: 7  Current functional level related to goals / functional outcomes: Pt is indep with all functional mobility and ADL's   Remaining deficits: Low grade discomfort   Education / Equipment: Management of condition. HEP    Patient agrees to discharge. Patient goals were met. Patient is being discharged due to meeting the stated rehab goals.  OUTPATIENT PHYSICAL THERAPY LOWER EXTREMITY TREATMENT   Patient Name: Lynn Rodriguez MRN: [id number] DOB:01/26/1981, 41 y.o., female Today's Date: 12/16/2021   PT End of Session - 02/03/22 0926     Visit Number 7    Number of Visits 13    Date for PT Re-Evaluation 02/03/22    PT Start Time 0815    PT Stop Time 0900    PT Time Calculation (min) 45 min    Activity Tolerance Patient tolerated treatment well    Behavior During Therapy [facility] for tasks assessed/performed              Past Medical History:  Diagnosis Date   ADHD (attention deficit hyperactivity disorder)    prn med.   Arthritis    knees   Asthma    prn inhaler   De Quervain's tenosynovitis, left 09/2013   Dental crown present    GERD (gastroesophageal reflux disease)    prn med.   Past Surgical History:  Procedure Laterality Date   CHOLECYSTECTOMY  08/23/2009   lap. chole.   DORSAL COMPARTMENT RELEASE Left 10/24/2013   Procedure: RELEASE DORSAL COMPARTMENT (DEQUERVAIN) LEFT WRIST;  Surgeon: Jolyn Nap, MD;  Location: [facility];  Service: Orthopedics;  Laterality: Left;   KNEE ARTHROSCOPY Bilateral    TONSILLECTOMY AND ADENOIDECTOMY     Patient Active Problem List   Diagnosis Date Noted   Vitamin D deficiency 12/05/2010   NAUSEA AND VOMITING 03/08/2010   MYALGIA 01/30/2010   CONTACT DERMATITIS&OTHER ECZEMA DUE UNSPEC CAUSE 01/09/2010   CANDIDIASIS OF VULVA AND VAGINA 12/21/2009   URI 12/21/2009   PLANTAR FASCIITIS 10/25/2009   MUSCLE SPASM, TRAPEZIUS 10/25/2009   MUSCLE CRAMPS,  FOOT 10/25/2009   ASTHMA NOS W/ACUTE EXACERBATION 08/29/2009   STREPTOCOCCAL SORE THROAT 07/24/2008   ABDOMINAL PAIN RIGHT LOWER QUADRANT 05/08/2008   ALLERGIC ASTHMA 08/19/2007   COUGH 08/19/2007   GERD 08/06/2007   PELVIC  PAIN 04/28/2007   DERMATOPHYTOSIS, FOOT 12/14/2006   Acute sinusitis, unspecified 12/14/2006   ALLERGIC RHINITIS 12/14/2006    PCP:  Dr Bernerd Limbo   REFERRING PROVIDER: Dr Melina Schools   REFERRING DIAG: M54.51 (ICD-10-CM) - Vertebrogenic low back pain  THERAPY DIAG:  Other low back pain - Plan: PT plan of care cert/re-cert  Pain in left hip - Plan: PT plan of care cert/re-cert  Rationale for Evaluation and Treatment Rehabilitation  ONSET DATE:   SUBJECTIVE:   SUBJECTIVE STATEMENT: "[place] trip went well, pain was minimal able to walk really unlimited"  PERTINENT HISTORY: Knee OA, ADHD, GERD Bil knee arthroscopy  PAIN:  Are you having pain? Yes: NPRS scale: 3/10 Pain location: L ant/lat hip Pain description: dull, achey Aggravating factors: Prolonged sitting, walking Relieving factors: Ice, heat, stretching  PRECAUTIONS: None  WEIGHT BEARING RESTRICTIONS No  FALLS:  Has patient fallen in last 6 months? No  LIVING ENVIRONMENT: Lives with: lives with their family Lives in: House/apartment Stairs: Yes: Internal: 1 flight Has following equipment at home: None  OCCUPATION: Works for non-profit  PLOF: Independent  PATIENT GOALS Decrease pain, strengthen hip  and knee   OBJECTIVE: 3  DIAGNOSTIC FINDINGS:   [place] for DVT: Negative   PATIENT SURVEYS:  FOTO 51 Expected - 68, 11 visits  COGNITION:  Overall cognitive status: Within functional limits for tasks assessed     SENSATION: WFL   POSTURE: rounded shoulders and increased lumbar lordosis  PALPATION: Pt is tender to palpation over L hip posterolateral musculature, more irritable  LOWER EXTREMITY ROM:  Active ROM Right eval Left eval  Hip flexion    Hip extension     Hip abduction    Hip adduction    Hip internal rotation    Hip external rotation Limited Limited, painful  Knee flexion    Knee extension  0  Ankle dorsiflexion    Ankle plantarflexion    Ankle inversion    Ankle eversion     (Blank rows = not tested)  LOWER EXTREMITY MMT:  MMT Right eval Left eval R / L 02/03/22  Hip flexion 46.4 44.5 66.9 / 77.6  Hip extension     Hip abduction 64.1 67.9 75.7 / 75  Hip adduction     Hip internal rotation     Hip external rotation     Knee flexion     Knee extension 64.4` 48.0 70.4 / 71.8  Ankle dorsiflexion     Ankle plantarflexion     Ankle inversion     Ankle eversion      (Blank rows = not tested)  Lumbar extension full without pain limitation  LOWER EXTREMITY SPECIAL TESTS:    - Straight leg raise: + at 60deg with reproduction of calf/posterior thigh pain 02/03/22 SLR test Neg  GAIT:   - Pt displays Trendelenburg gait pattern, L>R Gait: level pelvis, WNL    TODAY'S TREATMENT: Pt seen for aquatic therapy today.  Treatment took place in water 3.25-4.8 ft in depth at the [facility] pool. Temp of water was 91.  Pt entered/exited the pool via stairs (step through pattern) independently with bilat rail.  Warm up of walking forward and backward lunges and side stepping Curtsy lunge holding yellow noodle x10 Monster walk x 2 widths Forward walking hurdles with coordinated arms and core engaged x 2 widths Suspended by yellow noodle - bilat clams 2 x 10 no pain  Repeated single clams in stork stance x 10, no pain Side deep squat with arm abdct/ add with yellow hand buoys x 4 widths Forward walking lunge holding yellow hand buoys at side Quad stretch with foot on bench x 3 reps each LE; hip flex stretch leaning forward 3x20s R/L   Pt requires buoyancy for support and to offload joints with strengthening exercises. Viscosity of the water is needed for resistance of strengthening; water current perturbations provides  challenge to standing balance unsupported, requiring increased core activation.  Objective testing  PATIENT EDUCATION:  Education details: reviewed possible aquatic exercises to do while away.  Person educated: Patient Education method: Explanation Education comprehension: verbalized understanding   HOME EXERCISE PROGRAM:  Access Code: [email or web address] URL: [email or web address] Date: 12/26/2021 Prepared by: [facility]  - 1 x daily - 3 x weekly - 2 sets - 10 reps  ( told about reverse to tap knee and reg to tap foot behind) - Supine Active Straight Leg Raise  - 1 x daily - 3 x weekly - 2 sets - 10 reps - Staggered Stance Squat  - 1 x daily - 7 x weekly - 1-2 sets -  3 reps - Hooklying Single Knee to Chest Stretch  - 1 x daily - 7 x weekly - 1 sets - 2 reps - 10-20 seconds  hold - Supine Piriformis Stretch with Foot on Ground  - 1-2 x daily - 7 x weekly - 1 sets - 2 reps - 10-20 seconds hold - Supine Hip Internal and External Rotation  - 1 x daily - 7 x weekly - 1 sets - 2 reps - 10-20 seconds hold - Seated Table Piriformis Stretch  - 1 x daily - 7 x weekly - 1 sets - 2 reps - 10-20 seconds hold - Lateral Lunge Adductor Stretch with Counter Support  - 1 x daily - 7 x weekly - 1 sets - 2 reps - 10-20 seconds  hold  ASSESSMENT:  CLINICAL IMPRESSION: Pt reports overall pain 60% improved.  Sciatic pain almost eliminated. Slightly increases with sitting >6 hours (on flight). Up, walking, and stretching ~10 mins eliminates.  She reports compliance with HEP.  Testing today demonstrates pt has met all goals, exceeding many of them particularly in strength. Pt reports amb/on feet for multiple hours without increase in pain sx when away ([place]).  She reports indep with aquatic exercises declines written/laminated copy.  She is ready for dc.   OBJECTIVE IMPAIRMENTS Abnormal gait, decreased activity tolerance, decreased balance, decreased  ROM, and decreased strength.   ACTIVITY LIMITATIONS sitting, standing, and stairs  PARTICIPATION LIMITATIONS: driving and occupation  [place], Past/current experiences, and Time since onset of injury/illness/exacerbation are also affecting patient's functional outcome.   REHAB POTENTIAL: Good  CLINICAL DECISION MAKING: Evolving/moderate complexity  EVALUATION COMPLEXITY: Moderate   GOALS: Goals reviewed with patient? Yes  SHORT TERM GOALS: Target date: 12/07/2021 Patient will exhibit 0 degrees of knee extension actively to improve standing and gait mechanics. Baseline: Goal status: Achieved  2.  Patient will demonstrate full lumbar extension without pain. Baseline:  Goal status: Achieved  3.  Patient will improve bil hip abductor strength by 10% to reduce gait deviations. Baseline:  Goal status:Achieved   LONG TERM GOALS: Target date:01/27/2022  Patient will be able to walk for 30 minutes without pain. Baseline:  Goal status: Achieved  2.  Pt will be able to tolerate 30 minutes of sitting without increasing pain. Baseline:  Goal status: Achieved  3.  Pt will be fully independent with exercise and gym routine to maintain current level of fitness. Baseline:  Goal status: Achieved    PLAN: PT FREQUENCY: 1 x week  PT DURATION: 1 week  PLANNED INTERVENTIONS: Therapeutic exercises, Therapeutic activity, Neuromuscular re-education, Balance training, Gait training, Patient/Family education, Joint mobilization, Aquatic Therapy, Spinal mobilization, Cryotherapy, Moist heat, Ionotophoresis 12m/ml Dexamethasone, and Manual therapy  PLAN FOR NEXT SESSION:    DC  MStanton Kidney(Tharon Aquas Jovaun Levene MPT 02/03/22 9:48 AM

## 2022-02-07 ENCOUNTER — Ambulatory Visit (HOSPITAL_BASED_OUTPATIENT_CLINIC_OR_DEPARTMENT_OTHER): Payer: Self-pay | Admitting: Physical Therapy

## 2022-10-01 ENCOUNTER — Ambulatory Visit: Payer: 59 | Admitting: Family Medicine

## 2023-07-02 IMAGING — US US EXTREM LOW VENOUS*L*
1 series · 14 of 24 positions shown · non-contrast
Comparison: None.

CLINICAL DATA: Left leg pain for 3 days

EXAM:
LEFT LOWER EXTREMITY VENOUS DOPPLER ULTRASOUND
TECHNIQUE: Gray-scale sonography with compression, as well as color and duplex
ultrasound, were performed to evaluate the deep venous system(s)
from the level of the common femoral vein through the popliteal and
proximal calf veins.

[Series 1: us extrem low venous*left* · 14 of 43 slices shown]
[im 1/43]
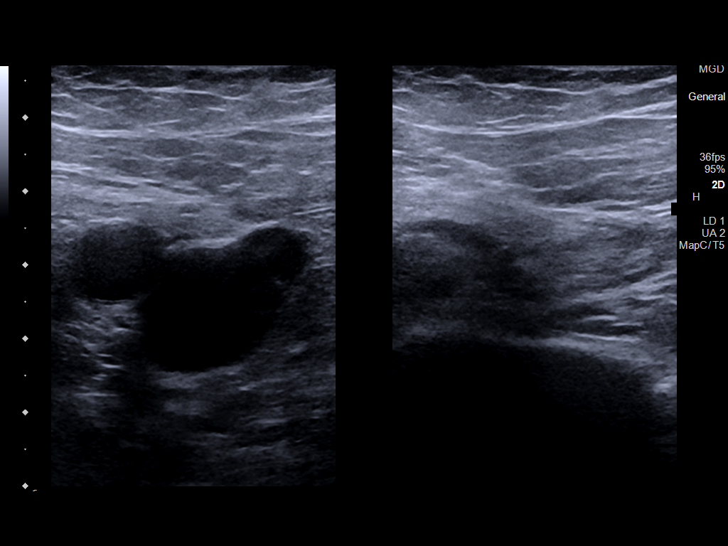
[im 4/43]
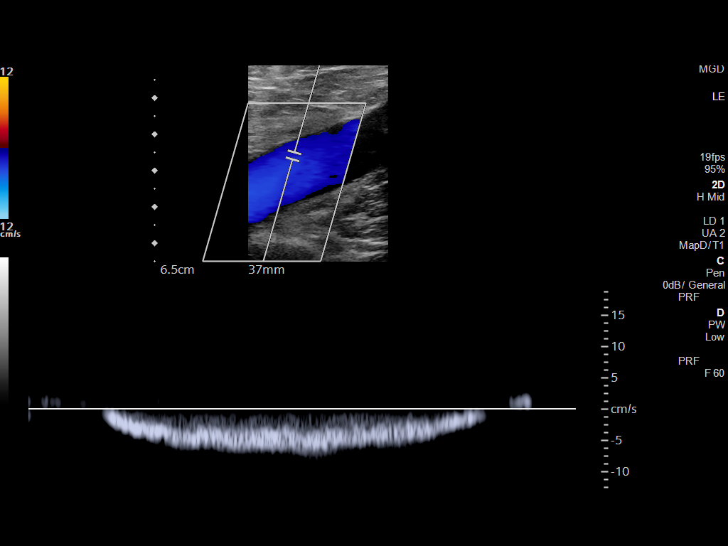
[im 8/43]
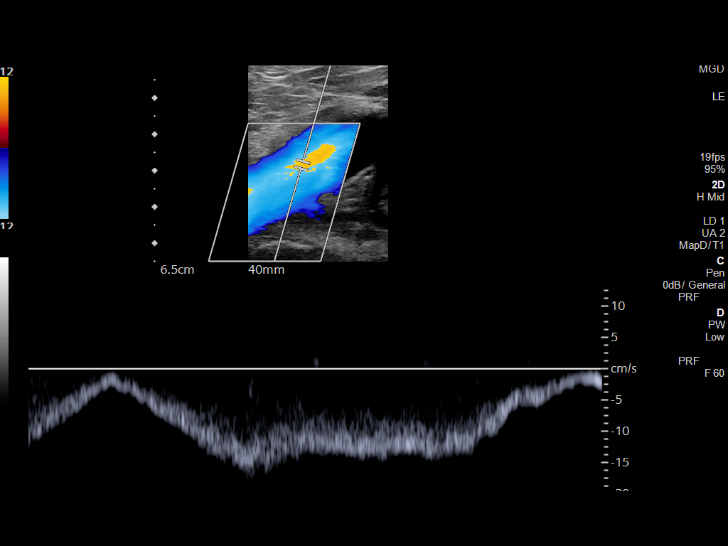
[im 11/43]
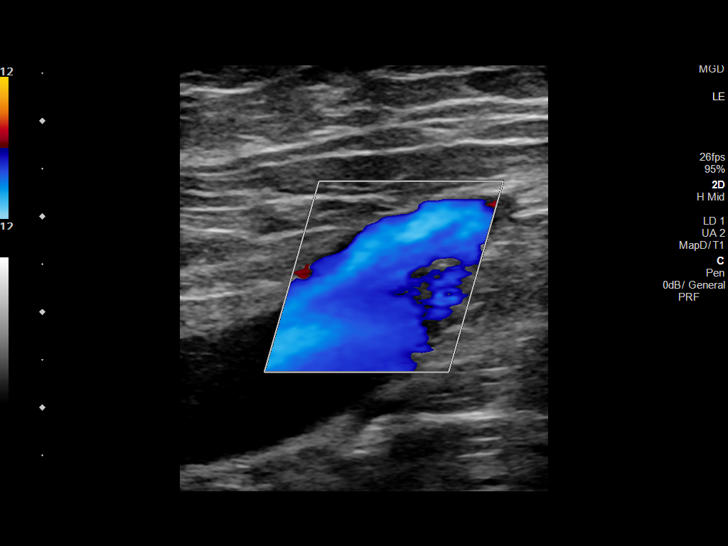
[im 13/43]
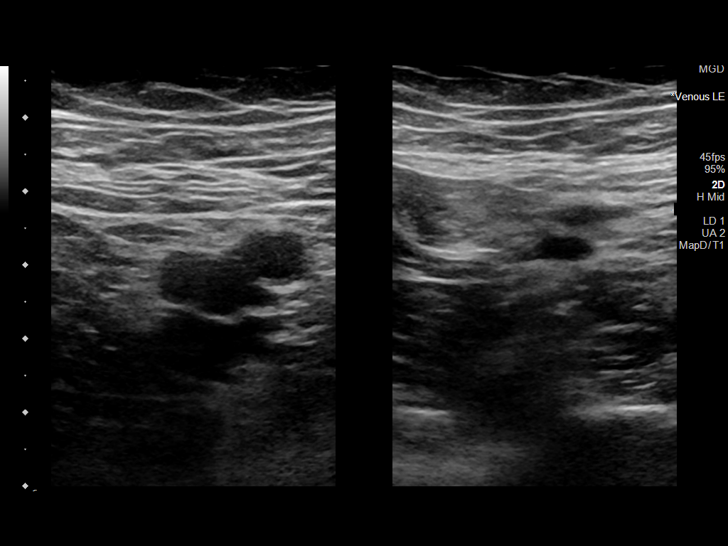
[im 17/43]
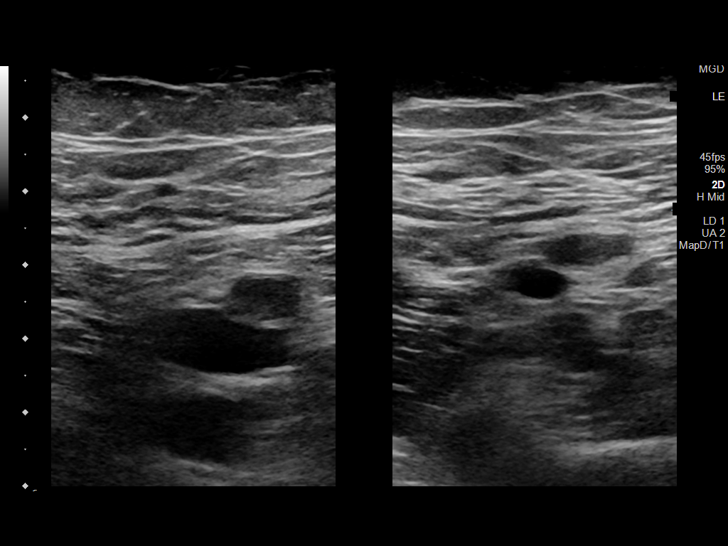
[im 21/43]
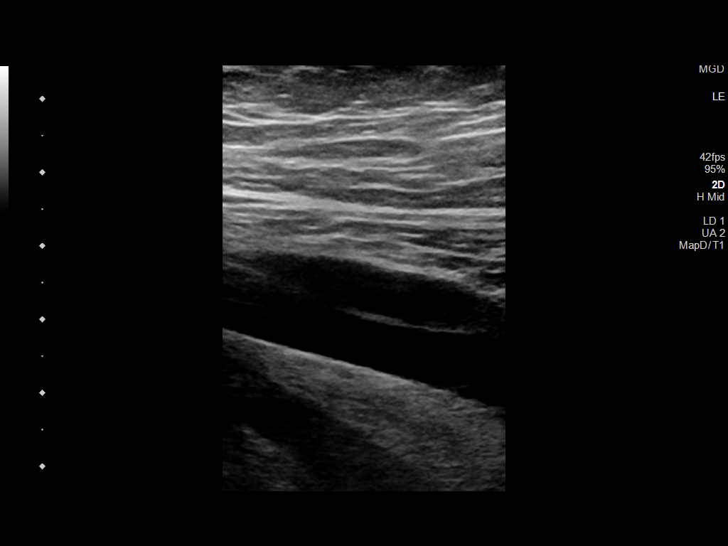
[im 22/43]
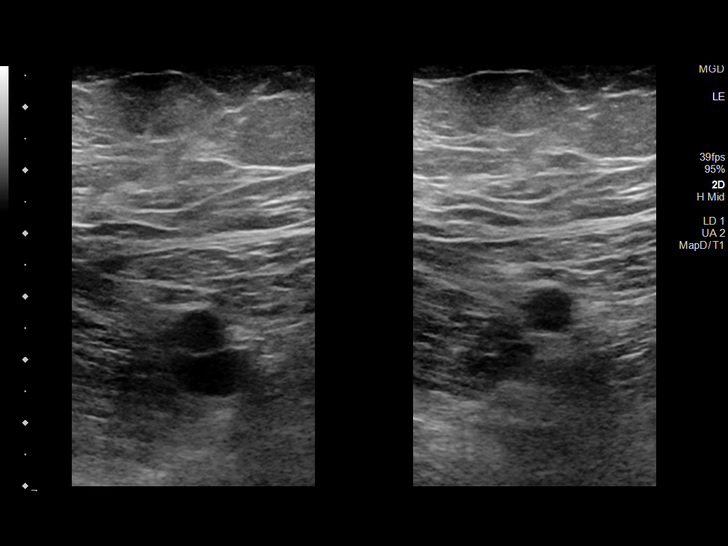
[im 26/43]
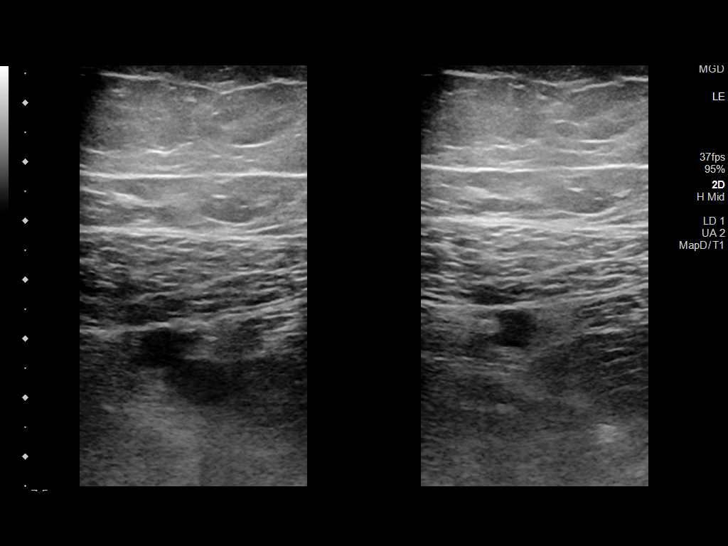
[im 30/43]
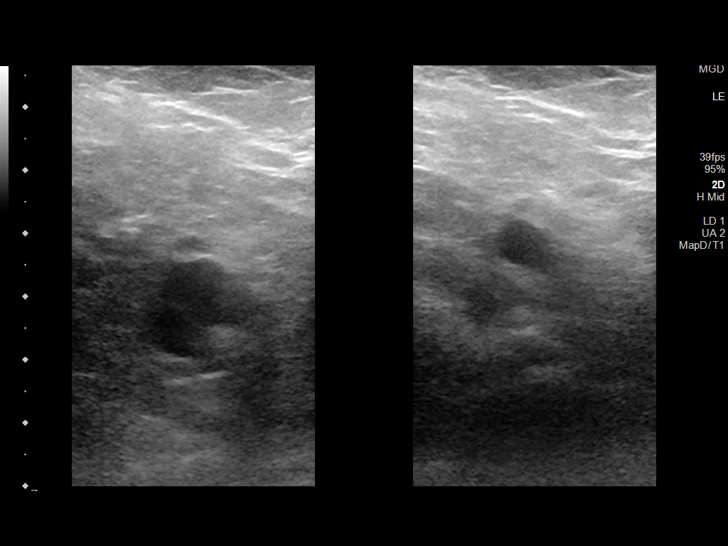
[im 33/43]
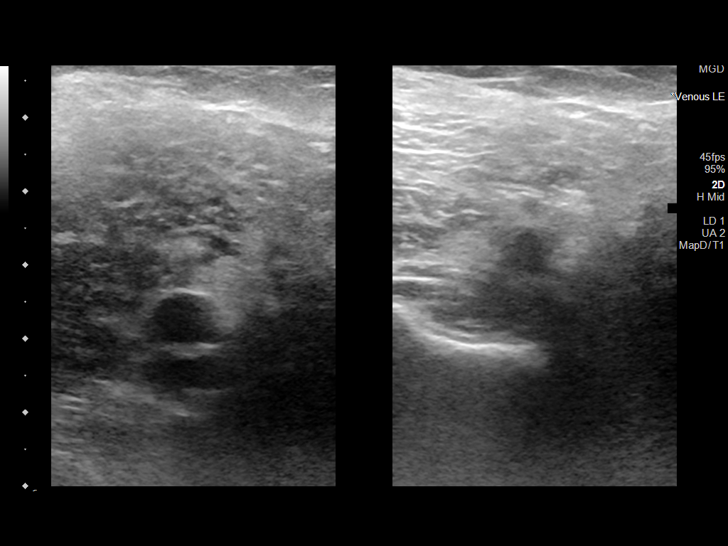
[im 35/43]
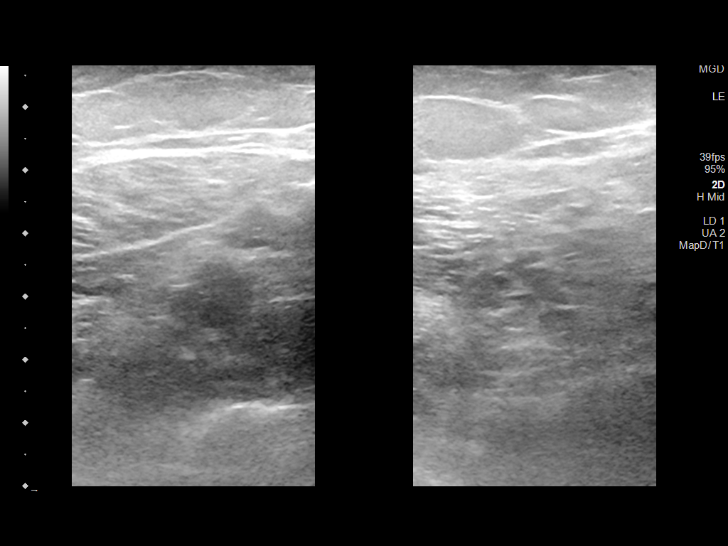
[im 39/43]
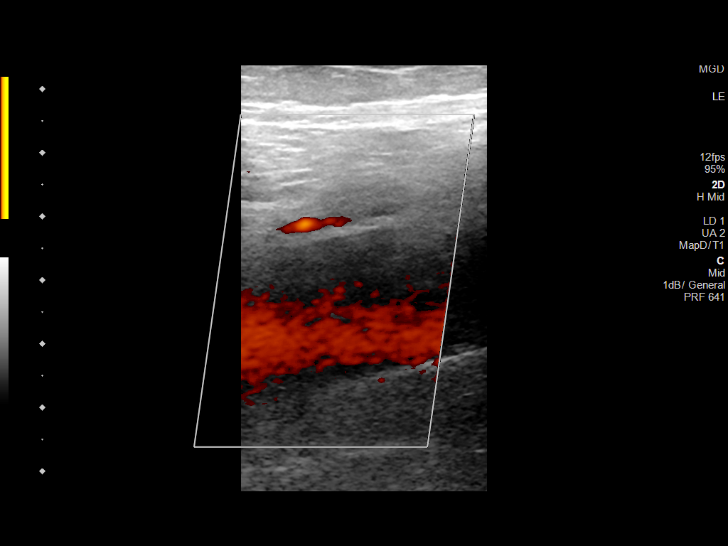
[im 43/43]
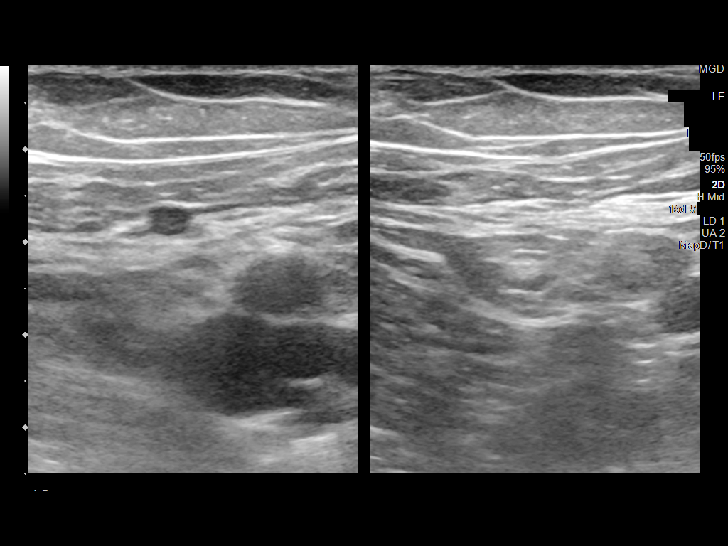

[14 of 24 positions shown; findings below may reference images not displayed]

FINDINGS: VENOUS

Normal compressibility of the common femoral, superficial femoral,
and popliteal veins, as well as the visualized calf veins.
Visualized portions of profunda femoral vein and great saphenous
vein unremarkable. No filling defects to suggest DVT on grayscale or
color Doppler imaging. Doppler waveforms show normal direction of
venous flow, normal respiratory plasticity and response to
augmentation.

Limited views of the contralateral common femoral vein are
unremarkable.

OTHER

None.

Limitations: none
IMPRESSION: 1. No evidence of deep venous thrombosis within the left lower
extremity.

## 2024-07-31 ENCOUNTER — Ambulatory Visit (HOSPITAL_COMMUNITY)
Admission: RE | Admit: 2024-07-31 | Discharge: 2024-07-31 | Disposition: A | Discharge status: Home / Self Care | Attending: Family Medicine

## 2024-07-31 ENCOUNTER — Encounter (HOSPITAL_COMMUNITY): Payer: Self-pay

## 2024-07-31 VITALS — BP 114/74 | HR 68 | Temp 98.0°F | Resp 16

## 2024-07-31 DIAGNOSIS — S0093XA Contusion of unspecified part of head, initial encounter: Secondary | ICD-10-CM

## 2024-07-31 DIAGNOSIS — S1093XA Contusion of unspecified part of neck, initial encounter: Secondary | ICD-10-CM | POA: Diagnosis not present

## 2024-07-31 MED ORDER — CYCLOBENZAPRINE HCL 5 MG PO TABS: 5.0000 mg | ORAL_TABLET | Freq: Every evening | ORAL | 0 refills | Status: AC | PRN

## 2024-07-31 NOTE — ED Triage Notes (Signed)
 Patient here today with c/o falling last night from slipping on ice and hitting the back of her head on a car bumper. Patient has taken Naproxen  with no relief. Patient denies passing out. No dizziness, just a dull headache in the back of her head that radiates down her neck and into her traps.

## 2024-07-31 NOTE — ED Provider Notes (Signed)
 " [facility], Television/film[facility]  Note:  This document was prepared using Conservation officer, historic buildings and may include unintentional dictation errors.  MRN: [id number] DOB: 1981-01-13  Subjective:   Lynn Rodriguez is a 44 y.o. female presenting for 1 day history of posterior scalp pain that radiates to the neck and trapezius worse on the right side.  Symptoms started after patient accidentally slipped and fell last night.  She slipped on ice and made impact against the car bumper impacting the back of her head as best that she can estimate.  Has used naproxen  without much relief.  No loss of consciousness, confusion, vision change, nausea, vomiting, weakness, numbness or tingling, loss of bowel or bladder control, speech disturbance.  Patient reports that she is allergic to ibuprofen , causes swelling.  She also states that she cannot take Mobic  but does not know why.  She has not experienced any kind of reaction with naproxen .  Current Outpatient Medications  Medication Instructions   albuterol  (PROAIR  HFA) 108 (90 BASE) MCG/ACT inhaler 2 puffs, Every 6 hours PRN   albuterol  (PROAIR  HFA) 108 (90 Base) MCG/ACT inhaler Inhalation   albuterol  (PROVENTIL ) 2.5 mg   amphetamine-dextroamphetamine (ADDERALL) 10 MG tablet 10 mg, Daily PRN   beclomethasone (QVAR) 80 MCG/ACT inhaler 2 puffs, Inhalation, 2 times daily   calcium carbonate (OS-CAL - DOSED IN MG OF ELEMENTAL CALCIUM) 1250 (500 Ca) MG tablet Oral   cetirizine  (ZYRTEC ) 10 mg, Oral, Daily   cetirizine  (ZYRTEC ) 10 mg, Daily   Cholecalciferol  (D 2000) 2000 units TABS Frequency:   Dosage:0.0     Instructions:  Note:   cholecalciferol  (VITAMIN D ) 5,000 Units, Daily   clonazePAM (KLONOPIN) 0.5 mg, Oral   cyclobenzaprine  (FLEXERIL ) 5 MG tablet Oral   diclofenac  (VOLTAREN ) 75 mg, Oral, 2 times daily   DULERA 200-5 MCG/ACT AERO USE TWO PUFFS EVERY 12 HOURS TO PREVENT COUGH OR WHEEZING RINSE,GARGLE, AND SPIT AFTER USE   fluticasone (FLONASE)  50 MCG/ACT nasal spray 2 sprays, Each Nare, Daily   fluticasone (FLONASE) 50 MCG/ACT nasal spray Frequency:Daily   Dosage:1   MCG/ACT  Instructions:  Note:USE 1 TO 2 SPRAYS IN EACH NOSTRIL ONCE DAILY.   folic acid (FOLVITE) 1 mg, Oral, Daily   HYDROcodone -acetaminophen  (NORCO/VICODIN) 5-325 MG tablet 1 tablet, Oral, Every 6 hours PRN   ibuprofen  (ADVIL ,MOTRIN ) 600 MG tablet take 1 tablet by mouth every 6 to 8 hours if needed for pain   levocetirizine (XYZAL ) 5 mg, Oral, Every evening   Linaclotide (LINZESS) 145 MCG CAPS capsule Oral   methocarbamol  (ROBAXIN ) 500 mg, Oral, 2 times daily   mometasone-formoterol (DULERA) 100-5 MCG/ACT AERO Inhalation   Multiple Vitamin (MULTI-VITAMINS) TABS Oral   Multiple Vitamins-Minerals (MULTIVITAL PO) Oral   naproxen  (NAPROSYN ) 500 mg, Oral, 2 times daily   ranitidine (ZANTAC) 150 mg, 2 times daily PRN   SUMAtriptan (IMITREX) 50 mg, Oral   tiZANidine (ZANAFLEX) 4 mg, Oral, 2 times daily   VIMOVO 500-20 MG TBEC 1 tablet, Oral, 2 times daily    Allergies[1]  Past Medical History:  Diagnosis Date   ADHD (attention deficit hyperactivity disorder)    prn med.   Arthritis    knees   Asthma    prn inhaler   De Quervain's tenosynovitis, left 09/2013   Dental crown present    GERD (gastroesophageal reflux disease)    prn med.     Past Surgical History:  Procedure Laterality Date   CHOLECYSTECTOMY  08/23/2009   lap.  chole.   DORSAL COMPARTMENT RELEASE Left 10/24/2013   Procedure: RELEASE DORSAL COMPARTMENT (DEQUERVAIN) LEFT WRIST;  Surgeon: Alm DELENA Hummer, MD;  Location: [facility];  Service: Orthopedics;  Laterality: Left;   KNEE ARTHROSCOPY Bilateral    TONSILLECTOMY AND ADENOIDECTOMY      No family history on file.  Social History   Occupational History   Not on file  Tobacco Use   Smoking status: Never   Smokeless tobacco: Never  Substance and Sexual Activity   Alcohol use: Yes    Comment: 2 x/week   Drug use: No    Sexual activity: Not Currently    Partners: Male    Birth control/protection: None     ROS   Objective:   Vitals: BP 114/74 (BP Location: Left Arm)   Pulse 68   Temp 98 F (36.7 C) (Oral)   Resp 16   SpO2 96%   Physical Exam Constitutional:      General: She is not in acute distress.    Appearance: Normal appearance. She is well-developed. She is not ill-appearing, toxic-appearing or diaphoretic.  HENT:     Head: Normocephalic. Contusion (over area filled in) present. No raccoon eyes, Battle's sign, abrasion, masses, right periorbital erythema, left periorbital erythema or laceration.      Right Ear: External ear normal.     Left Ear: External ear normal.     Nose: Nose normal.     Mouth/Throat:     Mouth: Mucous membranes are moist.  Eyes:     General: No scleral icterus.       Right eye: No discharge.        Left eye: No discharge.     Extraocular Movements: Extraocular movements intact.  Neck:     Meningeal: Brudzinski's sign and Kernig's sign absent.  Cardiovascular:     Rate and Rhythm: Normal rate.  Pulmonary:     Effort: Pulmonary effort is normal.  Musculoskeletal:     Cervical back: Normal range of motion and neck supple. No swelling, edema, deformity, erythema, signs of trauma, lacerations, rigidity, spasms, torticollis, tenderness, bony tenderness or crepitus. Pain with movement present. No spinous process tenderness or muscular tenderness. Normal range of motion.  Skin:    General: Skin is warm and dry.  Neurological:     General: No focal deficit present.     Mental Status: She is alert and oriented to person, place, and time.     Cranial Nerves: No cranial nerve deficit, dysarthria or facial asymmetry.     Motor: No weakness or pronator drift.     Coordination: Romberg sign negative. Coordination normal. Finger-Nose-Finger Test and Heel to [facility] Test normal. Rapid alternating movements normal.     Gait: Gait and tandem walk normal.     Deep Tendon  Reflexes: Reflexes normal.  Psychiatric:        Mood and Affect: Mood normal.        Behavior: Behavior normal.        Thought Content: Thought content normal.        Judgment: Judgment normal.     Assessment and Plan :   PDMP not reviewed this encounter.  1. Contusion of head and neck region      No signs of an acute intracranial injury.  Will defer ER visit.  Recommend conservative management, brain rest.  Will avoid the use of NSAIDs.  Patient was in agreement.  I am prescribing a muscle relaxant.  Counseled patient on  potential for adverse effects with medications prescribed/recommended today, ER and return-to-clinic precautions discussed, patient verbalized understanding.     [1]  Allergies Allergen Reactions   Gelatin Other (See Comments)    SEVERE MIGRAINES   Percocet [Oxycodone-Acetaminophen ] Other (See Comments)    IRREGULAR HEARTBEAT   Porcine (Pork) Protein-Containing Drug Products Other (See Comments)    SEVERE MIGRAINES   Symbicort [Budesonide-Formoterol Fumarate]    Amoxicillin  Rash   Penicillins Rash   Singulair [Montelukast] Anxiety     Christopher Savannah, PA-C 07/31/24 1330  "
